# Patient Record
Sex: Male | Born: 1955 | Race: White | Hispanic: No | Marital: Married | Smoking: Former smoker
Health system: Southern US, Community
[De-identification: ages and names within clinical notes are randomized; demographics above are authoritative.]

## PROBLEM LIST (undated history)

## (undated) DIAGNOSIS — E7800 Pure hypercholesterolemia, unspecified: Secondary | ICD-10-CM

## (undated) DIAGNOSIS — E119 Type 2 diabetes mellitus without complications: Secondary | ICD-10-CM

## (undated) DIAGNOSIS — Z87442 Personal history of urinary calculi: Secondary | ICD-10-CM

## (undated) DIAGNOSIS — I10 Essential (primary) hypertension: Secondary | ICD-10-CM

## (undated) DIAGNOSIS — K449 Diaphragmatic hernia without obstruction or gangrene: Secondary | ICD-10-CM

## (undated) DIAGNOSIS — C801 Malignant (primary) neoplasm, unspecified: Secondary | ICD-10-CM

## (undated) DIAGNOSIS — K219 Gastro-esophageal reflux disease without esophagitis: Secondary | ICD-10-CM

## (undated) HISTORY — PX: COLONOSCOPY: SHX174

## (undated) HISTORY — PX: TONSILLECTOMY: SUR1361

## (undated) HISTORY — DX: Diaphragmatic hernia without obstruction or gangrene: K44.9

## (undated) HISTORY — PX: HERNIA REPAIR: SHX51

## (undated) HISTORY — PX: CHOLECYSTECTOMY: SHX55

---

## 2003-03-10 ENCOUNTER — Encounter: Payer: Self-pay | Admitting: Internal Medicine

## 2003-03-10 ENCOUNTER — Ambulatory Visit (HOSPITAL_COMMUNITY): Admission: RE | Admit: 2003-03-10 | Discharge: 2003-03-10 | Payer: Self-pay | Admitting: Internal Medicine

## 2005-10-09 ENCOUNTER — Ambulatory Visit: Admission: RE | Admit: 2005-10-09 | Discharge: 2005-10-09 | Payer: Self-pay

## 2005-10-25 ENCOUNTER — Ambulatory Visit: Payer: Self-pay | Admitting: Pulmonary Disease

## 2005-10-26 ENCOUNTER — Ambulatory Visit (HOSPITAL_COMMUNITY): Admission: RE | Admit: 2005-10-26 | Discharge: 2005-10-26 | Payer: Self-pay | Admitting: Internal Medicine

## 2005-10-26 ENCOUNTER — Ambulatory Visit: Payer: Self-pay | Admitting: Internal Medicine

## 2005-12-20 ENCOUNTER — Ambulatory Visit: Payer: Self-pay | Admitting: Pulmonary Disease

## 2006-08-22 ENCOUNTER — Ambulatory Visit (HOSPITAL_COMMUNITY): Admission: RE | Admit: 2006-08-22 | Discharge: 2006-08-22 | Payer: Self-pay | Admitting: Internal Medicine

## 2007-03-14 ENCOUNTER — Ambulatory Visit (HOSPITAL_COMMUNITY): Admission: RE | Admit: 2007-03-14 | Discharge: 2007-03-14 | Payer: Self-pay | Admitting: Internal Medicine

## 2007-06-21 HISTORY — PX: PROSTATECTOMY: SHX69

## 2009-05-15 ENCOUNTER — Ambulatory Visit (HOSPITAL_COMMUNITY): Admission: RE | Admit: 2009-05-15 | Discharge: 2009-05-15 | Payer: Self-pay | Admitting: Internal Medicine

## 2010-01-11 ENCOUNTER — Ambulatory Visit (HOSPITAL_COMMUNITY): Admission: RE | Admit: 2010-01-11 | Discharge: 2010-01-11 | Payer: Self-pay | Admitting: Internal Medicine

## 2010-11-05 NOTE — Procedures (Signed)
NAME:  Elijah Henson, THIELKE NO.:  [id number]   MEDICAL RECORD NO.:  [id number]          PATIENT TYPE:  OUT   LOCATION:  SLEEP LAB                     FACILITY:  [facility]   PHYSICIAN:  Marcelyn Bruins, M.D. [facility] DATE OF BIRTH:  January 11, 1956   DATE OF STUDY:  10/09/2005                              NOCTURNAL POLYSOMNOGRAM   REFERRING PHYSICIAN:  Dr. Carylon Perches   INDICATION FOR STUDY:  Hypersomnia with sleep apnea.   EPWORTH SCORE:  8   SLEEP ARCHITECTURE:  The patient had a total sleep time of 235 minutes with  decreased slow wave sleep as well as REM.  Sleep onset latency was prolonged  at 109 minutes and REM latency was 140 minutes.  Sleep efficiency was very  poor at 58%.   RESPIRATORY DATA:  The patient was found to have 19 hypopneas, 33 apneas,  and six central apneas for a respiratory disturbance index of 15 events per  hour.  The events were not positional but there was mild to moderate snoring  noted.  The patient did not meet split night criteria secondary to the  events not occurring until well after 1 a.m.   OXYGEN DATA:  There was O2 desaturation as low as 88% with the obstructive  events.   CARDIAC DATA:  No clinically significant cardiac arrhythmias.   MOVEMENT/PARASOMNIAS:  Small numbers of leg jerks that were of no clinical  significance.   IMPRESSION/RECOMMENDATIONS:  Mild to moderate obstructive sleep apnea with a  respiratory disturbance index of 15 events per hour and oxygen desaturation  as low as 88%.  Treatment for this degree of sleep apnea may include weight  loss alone if applicable, upper airway surgery, oral appliance, and also  CPAP.  The decision to treat this patient should be based on how much this  degree of sleep-disordered breathing is affecting his quality of life, and  treatment decisions should also be based on lifestyle as well.                                            ______________________________  Marcelyn Bruins, M.D. [facility]  Diplomate, American Board of Sleep  Medicine    KC/MEDQ  D:  10/20/2005 11:51:22  T:  10/21/2005 08:41:42  Job:  [id number]

## 2010-11-05 NOTE — Op Note (Signed)
NAME:  Elijah Henson, Elijah Henson            ACCOUNT NO.:  [id number]   MEDICAL RECORD NO.:  [id number]          PATIENT TYPE:  AMB   LOCATION:  DAY                           FACILITY:  [facility]   PHYSICIAN:  R. Roetta Sessions, M.D. DATE OF BIRTH:  May 09, 1956   DATE OF PROCEDURE:  10/26/2005  DATE OF DISCHARGE:                                 OPERATIVE REPORT   PROCEDURE:  Screening colonoscopy.   INDICATIONS FOR PROCEDURE:  The patient is a 55 year old gentleman sent over  at the courtesy of Dr. Ouida Sills for colorectal cancer screening. He is devoid  of any lower GI tract symptoms. There is no family history of colon cancer.  He has never had his large GI tract imaged. Colonoscopy is now being done as  a standard screening maneuver. The potential risks, benefits, and  alternatives have been reviewed, questions answered. He is agreeable. Please  the documentation in the medical record.   MONITORING:  O2 saturation, blood pressure, pulse and respirations were  monitored throughout the entire procedure.   CONSCIOUS SEDATION:  Versed 5 mg IV, Demerol 125 mg IV in divided doses.   INSTRUMENT:  Olympus videochip system.   FINDINGS:  Digital rectal exam revealed no abnormalities.   ENDOSCOPIC FINDINGS:  The prep was suboptimal with a thin coating of stool  throughout the colon but it was fairly easily washed away.   RECTUM:  Examination of the rectal mucosa including retroflexed view of the  anal verge revealed no abnormalities.   COLON:  The colonic mucosa was surveyed from the rectosigmoid junction  through the left transverse and right colon to the area of the appendiceal  orifice, ileocecal valve and cecum. These structures were seen and  photographed for the record. From this level, the scope was slowly  withdrawn. All previously mentioned mucosal surfaces were again seen. The  colonic mucosa appeared normal. The patient tolerated the procedure well and  was reacted in endoscopy.   IMPRESSION:  1.  Normal rectum.  2.  Normal colon.   RECOMMENDATIONS:  Repeat screening colonoscopy in 10 years.      [name], M.D.  Electronically Signed     RMR/MEDQ  D:  10/26/2005  T:  10/27/2005  Job:  [id number]   cc:   Kingsley Callander. Ouida Sills, MD  Fax: [phone]

## 2010-11-05 NOTE — Assessment & Plan Note (Signed)
[facility]                               PULMONARY OFFICE NOTE   NAME:Elijah Henson, Elijah Henson                   MRN:          [id number]  DATE:12/20/2005                            DOB:          1955-12-30    HISTORY OF PRESENT ILLNESS:  The patient is a 55 year old gentleman who I  have been asked to see for management of obstructive sleep apnea.  This  patient has recently undergone nocturnal polysomnography on October 11, 2005  where he was found to have a respiratory disturbance index of approximately  15 events per hour.  The patient states that he typically goes to bed  between 10:30 and 11:30 and gets up at 8 a.m. to start his day.  He feels  that four out of seven days he has not rested and it is very hard for him to  get out of bed.  He feels better once he gets up and takes a shower and gets  moving.  He has been noted to have snoring by his wife to the point that she  is now sleeping in another room.  He has also been noted to have pause in  his breathing during sleep.  The patient states that he has some sleepiness  with periods of inactivity in the afternoon, but does fairly well in the  mornings.  He will occasionally take a nap in the afternoon.  He does not  fall asleep in the evening with T.V. or movies and denies any difficulties  with driving.  Even though the patient is very tired whenever he awakens in  the morning, he feels he does much better once he gets going and that this  is not adversely affecting his quality of life.  Of note, his weight is up  about 10 pounds over the last few years.   PAST MEDICAL HISTORY:  1.  Significant for hypertension.  2.  History of dyslipidemia.  3.  History of renal stones.  4.  Status post cholecystectomy.   CURRENT MEDICATIONS:  1.  Lotrel 10 mg daily.  2.  Zocor 20 mg daily.  3.  Urocit K daily.  4.  Aspirin 325 three to four days a week.  5.  Various vitamins and oils.   Patient has no  known drug allergies.   SOCIAL HISTORY:  The patient is married and has children.  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  He smokes an occasional cigar.   FAMILY HISTORY:  Remarkable for heart disease and cancer in his parents.   REVIEW OF SYSTEMS:  As per history of present illness.  Also see patient  intake form documented in the chart.   PHYSICAL EXAMINATION:  GENERAL:  He is a well-developed male in no acute  distress.  VITAL SIGNS:  Blood pressure 138/80, pulse 70, temperature 97.8, weight is  186 pounds.  He is 5 feet 8 inches tall.  O2 saturation is 96% on room air.  HEENT:  Pupils are equal, round, and reactive to light and accommodation.  Extraocular muscles are intact.  Nares  shows very slight deviation to the  left but both passages are quite patent.  Oropharynx shows very mild  elongation of the soft palate and uvula.  NECK:  Supple without JVD or lymphadenopathy.  There is no palpable  thyromegaly.  CHEST:  Totally clear.  CARDIAC:  Regular rate and rhythm.  No murmurs, rubs, or gallops.  ABDOMEN:  Soft, nontender with good bowel sounds.  GENITAL:  Not done and not indicated.  RECTAL:  Not done and not indicated.  BREASTS:  Not done and not indicated.  EXTREMITIES:  Lower extremities are without edema.  Pulses are intact  distally.  NEUROLOGIC:  He is alert and oriented with no obvious motor deficits.   IMPRESSION:  Mild obstructive sleep apnea documented by nocturnal  polysomnography.  Certainly, the patient is not asymptomatic but he does not  feel that it is overly affecting his quality of life at this point in time.  Certainly, this degree of sleep apnea should have very minimal  cardiovascular effects long-term as long as it does not worsen.  I have  outlined various treatment options for the patient including modest weight  loss alone for the next six months, oral appliance, and possibly CPAP.  His  upper airway anatomy is really not overly  significant and therefore probably  would not respond well to surgery.  Patient at this point in time would like  to take six months and work on weight loss.  He states he will call me if he  would like to try additional therapy.   PLAN:  1.  Work aggressively on weight loss over the next six months.  Patient is      to call me for follow-up if he is not successful in losing the weight      and improving his symptoms.  2.  Patient is also to call me should he decide to try an oral appliance or      possibly CPAP.                                   Barbaraann Share, MD, FCCP   [facility]/MedQ  DD:  01/03/2006  DT:  01/03/2006  Job #:  [id number]   cc:   Kingsley Callander. Ouida Sills, MD

## 2011-10-25 ENCOUNTER — Other Ambulatory Visit (HOSPITAL_COMMUNITY): Payer: Self-pay | Admitting: Internal Medicine

## 2011-10-25 DIAGNOSIS — E049 Nontoxic goiter, unspecified: Secondary | ICD-10-CM

## 2011-10-27 ENCOUNTER — Ambulatory Visit (HOSPITAL_COMMUNITY)
Admission: RE | Admit: 2011-10-27 | Discharge: 2011-10-27 | Disposition: A | Discharge status: Home / Self Care | Payer: BC Managed Care – PPO | Source: Ambulatory Visit | Attending: Internal Medicine | Admitting: Internal Medicine

## 2011-10-27 DIAGNOSIS — E049 Nontoxic goiter, unspecified: Secondary | ICD-10-CM | POA: Insufficient documentation

## 2011-10-28 ENCOUNTER — Other Ambulatory Visit (HOSPITAL_COMMUNITY): Payer: Self-pay | Admitting: Internal Medicine

## 2011-10-28 DIAGNOSIS — E079 Disorder of thyroid, unspecified: Secondary | ICD-10-CM

## 2011-11-03 ENCOUNTER — Other Ambulatory Visit (HOSPITAL_COMMUNITY): Payer: Self-pay | Admitting: Internal Medicine

## 2011-11-03 ENCOUNTER — Ambulatory Visit (HOSPITAL_COMMUNITY)
Admission: RE | Admit: 2011-11-03 | Discharge: 2011-11-03 | Disposition: A | Discharge status: Home / Self Care | Payer: BC Managed Care – PPO | Source: Ambulatory Visit | Attending: Internal Medicine | Admitting: Internal Medicine

## 2011-11-03 DIAGNOSIS — E079 Disorder of thyroid, unspecified: Secondary | ICD-10-CM

## 2011-11-03 DIAGNOSIS — E049 Nontoxic goiter, unspecified: Secondary | ICD-10-CM | POA: Insufficient documentation

## 2011-11-03 NOTE — Discharge Instructions (Signed)
Thyroid Biopsy The thyroid gland is a butterfly-shaped gland situated in the front of the neck. It produces hormones which affect metabolism, growth and development, and body temperature. A thyroid biopsy is a procedure in which small samples of tissue or fluid are removed from the thyroid gland or mass and examined under a microscope. This test is done to determine the cause of thyroid problems, such as infection, cancer, or other thyroid problems. There are 2 ways to obtain samples: 1. Fine needle biopsy. Samples are removed using a thin needle inserted through the skin and into the thyroid gland or mass.  2. Open biopsy. Samples are removed after a cut (incision) is made through the skin.  LET YOUR CAREGIVER KNOW ABOUT:   Allergies.   Medications taken including herbs, eye drops, over-the-counter medications, and creams.   Use of steroids (by mouth or creams).   Previous problems with anesthetics or numbing medicine.   Possibility of pregnancy, if this applies.   History of blood clots (thrombophlebitis).   History of bleeding or blood problems.   Previous surgery.   Other health problems.  RISKS AND COMPLICATIONS  Bleeding from the site. The risk of bleeding is higher if you have a bleeding disorder or are taking any blood thinning medications (anticoagulants).   Infection.   Injury to structures near the thyroid gland.  BEFORE THE PROCEDURE  This is a procedure that can be done as an outpatient. Confirm the time that you need to arrive for your procedure. Confirm whether there is a need to fast or withhold any medications. A blood sample may be done to determine your blood clotting time. Medicine may be given to help you relax (sedative). PROCEDURE Fine needle biopsy. You will be awake during the procedure. You may be asked to lie on your back with your head tipped backward to extend your neck. Let your caregiver know if you cannot tolerate the positioning. An area on your  neck will be cleansed. A needle is inserted through the skin of your neck. You may feel a mild discomfort during this procedure. You may be asked to avoid coughing, talking, swallowing, or making sounds during some portions of the procedure. The needle is withdrawn once tissue or fluid samples have been removed. Pressure may be applied to the neck to reduce swelling and ensure that bleeding has stopped. The samples will be sent for examination.  Open biopsy. You will be given general anesthesia. You will be asleep during the procedure. An incision is made in your neck. A sample of thyroid tissue or the mass is removed. The tissue sample or mass will be sent for examination. The sample or mass may be examined during the biopsy. If the sample or mass contains cancer cells, some or all of the thyroid gland may be removed. The incision is closed with stitches. AFTER THE PROCEDURE  Your recovery will be assessed and monitored. If there are no problems, as an outpatient, you should be able to go home shortly after the procedure. If you had a fine needle biopsy:  You may have soreness at the biopsy site for 1 to 2 days.  If you had an open biopsy:   You may have soreness at the biopsy site for 3 to 4 days.   You may have a hoarse voice or sore throat for 1 to 2 days.  Obtaining the Test Results It is your responsibility to obtain your test results. Do not assume everything is normal if you have   not heard from your caregiver or the medical facility. It is important for you to follow up on all of your test results. HOME CARE INSTRUCTIONS   Keeping your head raised on a pillow when you are lying down may ease biopsy site discomfort.   Supporting the back of your head and neck with both hands as you sit up from a lying position may ease biopsy site discomfort.   Only take over-the-counter or prescription medicines for pain, discomfort, or fever as directed by your caregiver.   Throat lozenges or gargling  with warm salt water may help to soothe a sore throat.  SEEK IMMEDIATE MEDICAL CARE IF:   You have severe bleeding from the biopsy site.   You have difficulty swallowing.   You have a fever.   You have increased pain, swelling, redness, or warmth at the biopsy site.   You notice pus coming from the biopsy site.   You have swollen glands (lymph nodes) in your neck.  Document Released: 04/03/2007 Document Revised: 05/26/2011 Document Reviewed: 09/03/2008 ExitCare Patient Information 2012 [employer]. 

## 2011-11-03 NOTE — Procedures (Signed)
PreOperative Dx: Complicated RIGHT thyroid mass question complicated cyst Postoperative Dx: Complicated RIGHT thyroid cyst Procedure:   US guided aspiration Radiologist:  Tyron Russell Anesthesia:  2 ml of 2% lidocaine Specimen:  11 ml of brownish fluid EBL:   < 1 ml Complications: None

## 2011-11-03 NOTE — Progress Notes (Signed)
Patient tolerated procedure well. Discharge instructions given. Patient is clear on instructions.

## 2012-10-23 IMAGING — US US GUIDANCE NEEDLE PLACEMENT
1 series · 9 of 9 positions shown · non-contrast
Comparison: Thyroid ultrasound 10/27/2011

CLINICAL DATA: Right thyroid mass/complicated cyst

IR ULTRASOUND GUIDED ASPIRATION/DRAINAGE, US ASPIRATION

[Series 1: us guidance needle placement · 0.10mm/px · 9 of 9 slices shown]
[im 1/9]
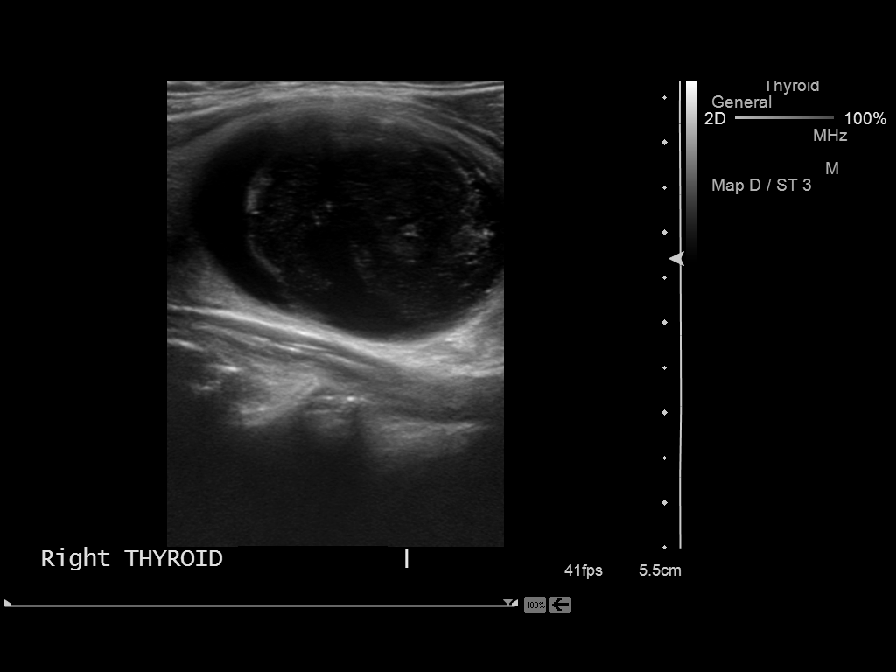
[im 2/9]
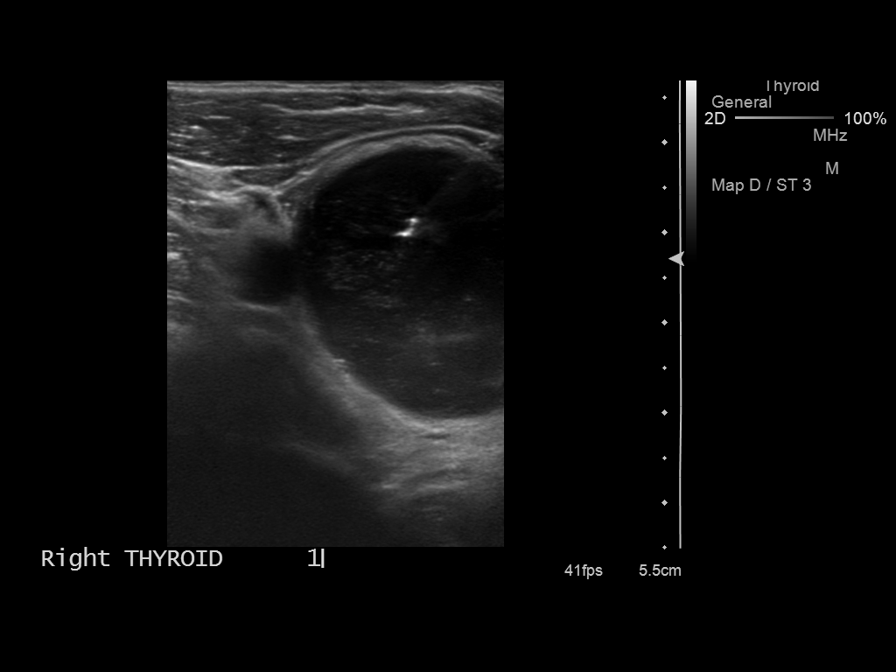
[im 3/9]
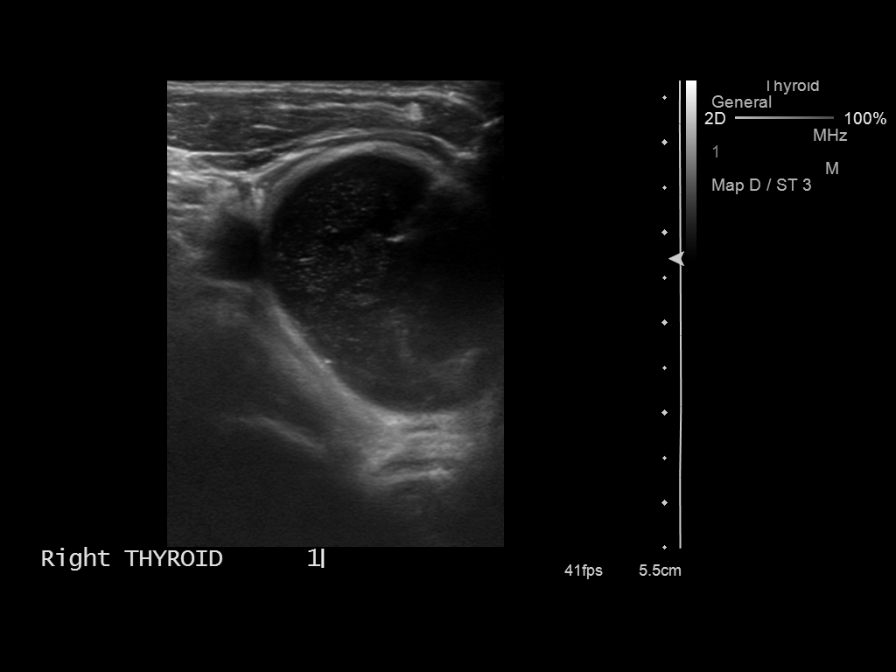
[im 4/9]
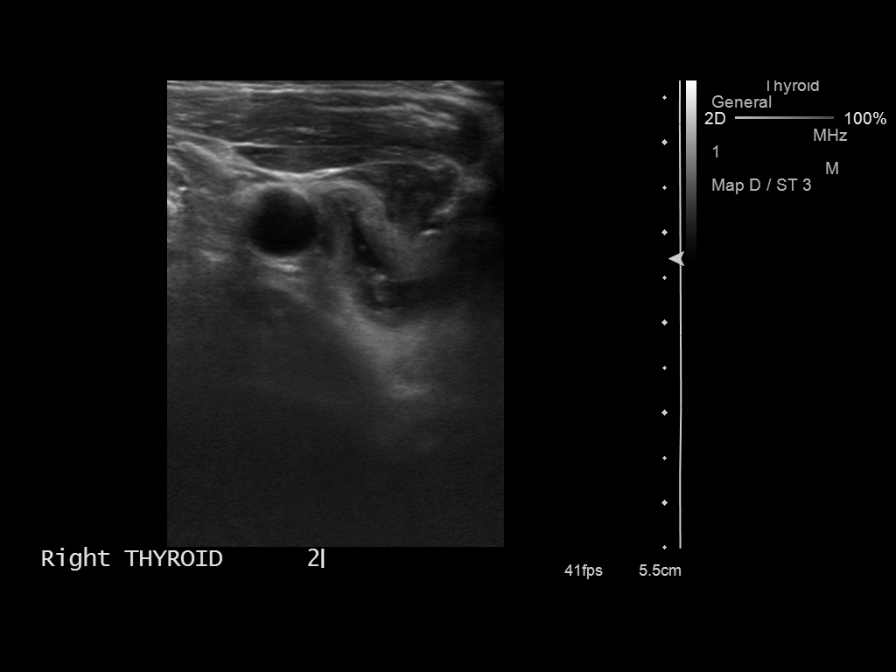
[im 5/9]
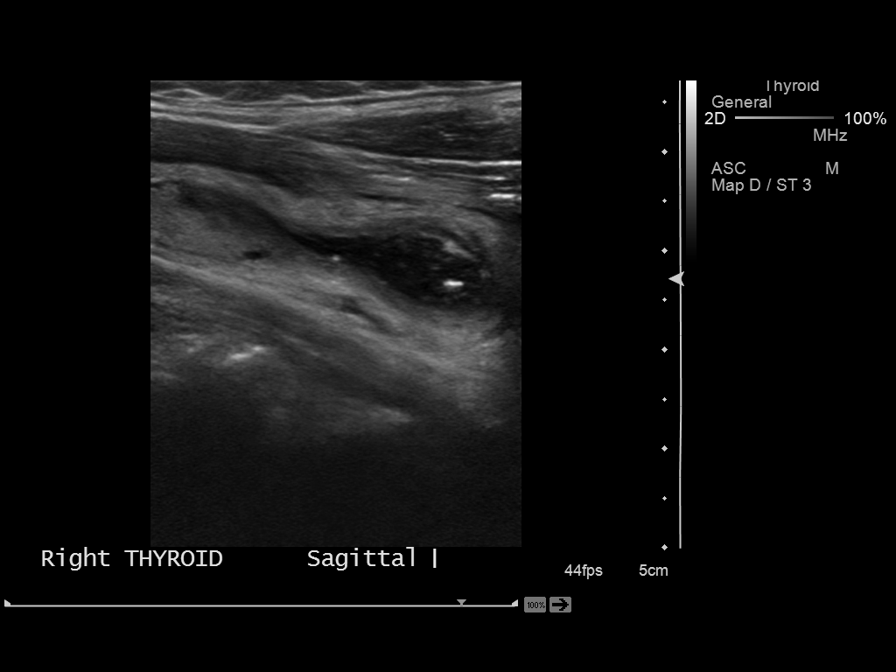
[im 6/9]
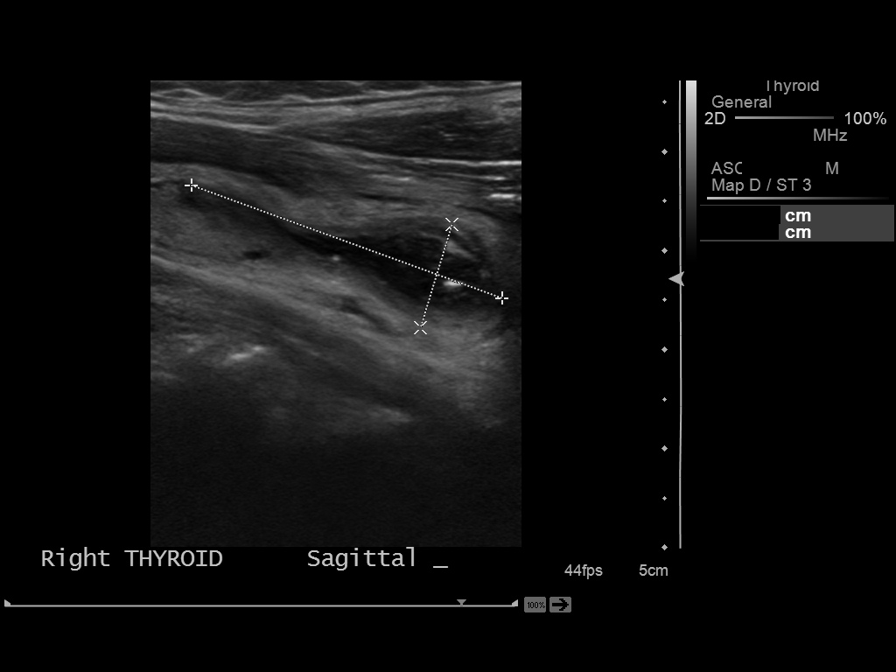
[im 7/9]
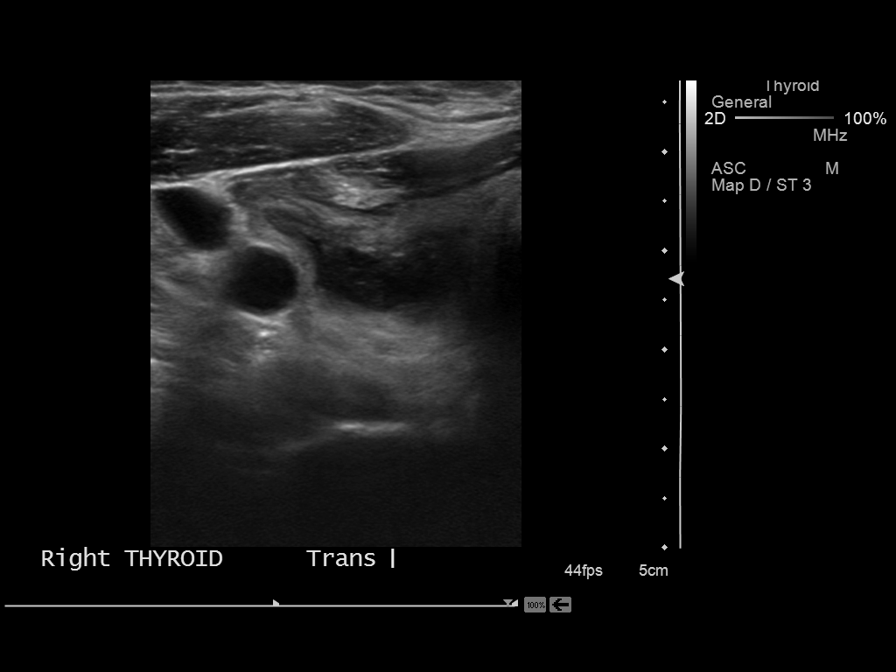
[im 8/9]
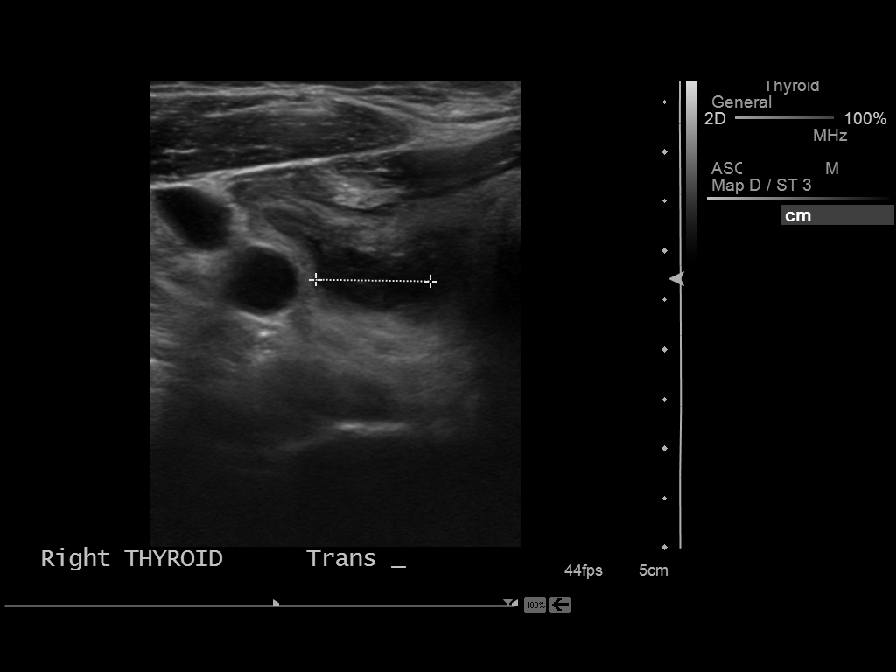
[im 9/9]
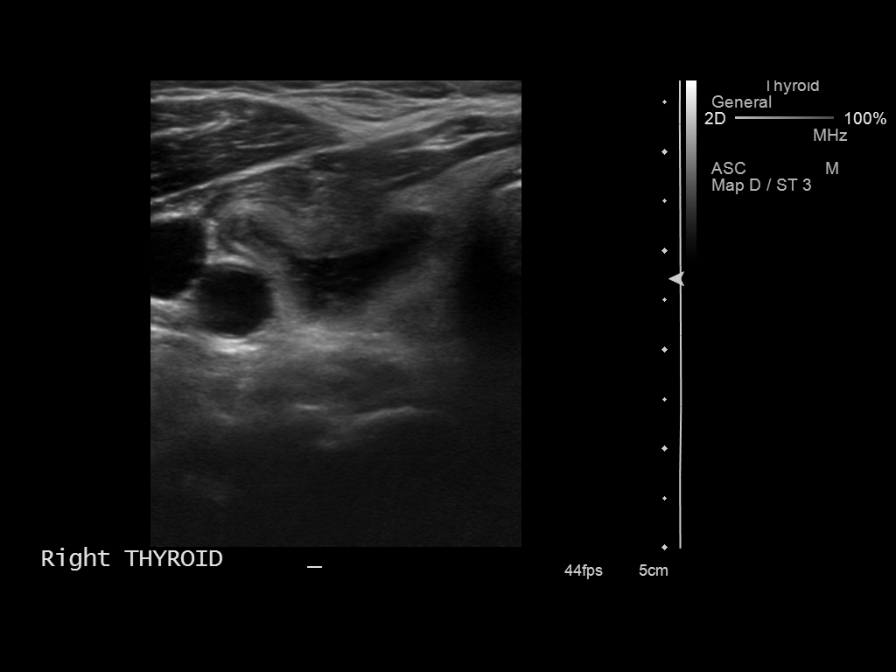

[9 of 9 positions shown; findings below may reference images not displayed]

FINDINGS: Written informed consent for procedure obtained after explanation
of the procedure, benefits, and risks.
Time-out protocol followed.
Skin prepped and draped in usual sterile fashion.
Skin and soft tissues anesthestized with 2 ml of 2% lidocaine.
Under sonographic guidance, a 25 gauge needle was advanced into the
3.8 cm diameter complicated cystic appearing mass in the right
thyroid lobe.
A tiny amount of brownish fluid was aspirated.
Subsequently, under sonographic guidance, an 18 gauge needle was
advanced into the lesion.
11 ml of brownish fluid was then aspirated without difficulty.
Postprocedural images demonstrate near complete collapse of the
previously identified lesion with dependent debris noted,
particularly at its caudal aspect.
The collapsed lesion demonstrates a minimally thickened wall.
Procedure tolerated well by patient without immediate complication.
IMPRESSION: Ultrasound guided aspiration of a complicated cystic lesion in the
right thyroid lobe as above.

## 2014-01-10 ENCOUNTER — Encounter: Payer: Self-pay | Admitting: Pulmonary Disease

## 2016-01-21 ENCOUNTER — Telehealth: Payer: Self-pay

## 2016-01-25 NOTE — Telephone Encounter (Signed)
What does 10/30 under medications mean?

## 2016-01-25 NOTE — Telephone Encounter (Signed)
Sorry but we need to clarify this medication. Sounds like a combo medication, amlodipine is combined with multiple different medications, can we find out which combo he is taking?

## 2016-01-25 NOTE — Telephone Encounter (Signed)
I spoke with [place] at [facility] and she said he is taking Amlodipine/Benazepril 10/20 ( Generic for Lotrell 10/20).

## 2016-01-25 NOTE — Telephone Encounter (Addendum)
Gastroenterology Pre-Procedure Review  Request Date: 01/21/2016 Requesting Physician: Dr. Willey Blade  PATIENT REVIEW QUESTIONS: The patient responded to the following health history questions as indicated:    PT'S LAST COLONOSCOPY WAS 10/2005   1. Diabetes Melitis: no 2. Joint replacements in the past 12 months: no 3. Major health problems in the past 3 months: no 4. Has an artificial valve or MVP: no 5. Has a defibrillator: no 6. Has been advised in past to take antibiotics in advance of a procedure like teeth cleaning: no 7. Family history:NO 8. Alcohol : less than once weekly 9. History of sleep apnea: No    MEDICATIONS & ALLERGIES:    Patient reports the following regarding taking any blood thinners:   Plavix? no Aspirin? no Coumadin? no  Patient confirms/reports the following medications:  Current Outpatient Prescriptions  Medication Sig Dispense Refill  . amLODipine-benazepril (LOTREL) 10-20 MG capsule Take 1 capsule by mouth daily.    . chlorthalidone (HYGROTON) 25 MG tablet Take 25 mg by mouth daily. 1/2 tablet daily    . NON FORMULARY Potassium  10 meq   2 tablets daily    . simvastatin (ZOCOR) 10 MG tablet Take 10 mg by mouth daily.     No current facility-administered medications for this visit.     Patient confirms/reports the following allergies:  No Known Allergies  No orders of the defined types were placed in this encounter.   AUTHORIZATION INFORMATION Primary Insurance:   ID #:   Group #:  Pre-Cert / Auth required:  Pre-Cert / Auth #:   Secondary Insurance:   ID #:  Group #:  Pre-Cert / Auth required:  Pre-Cert / Auth #:   SCHEDULE INFORMATION: Procedure has been scheduled as follows:  Date: 02/10/2016                Time:9:30 Am Location: [facility] Short Stay  This Gastroenterology Pre-Precedure Review Form is being routed to the following provider(s): R. Garfield Cornea, MD

## 2016-01-25 NOTE — Telephone Encounter (Signed)
Sorry, he told me that Amlodipine was 10/30 but I could not find that strength listed.

## 2016-01-25 NOTE — Telephone Encounter (Signed)
Ok to schedule. Med list has been updated. Thanks!

## 2016-02-03 ENCOUNTER — Other Ambulatory Visit: Payer: Self-pay

## 2016-02-03 DIAGNOSIS — Z1211 Encounter for screening for malignant neoplasm of colon: Secondary | ICD-10-CM

## 2016-02-03 MED ORDER — PEG 3350-KCL-NA BICARB-NACL 420 G PO SOLR: 4000.0000 mL | ORAL | 0 refills | Status: DC

## 2016-02-03 NOTE — Telephone Encounter (Signed)
Rx sent to the pharmacy and instructions mailed to pt.  

## 2016-02-03 NOTE — Telephone Encounter (Signed)
Rx has been sent to the pharmacy and instructions mailed to pt.

## 2016-02-09 ENCOUNTER — Telehealth: Payer: Self-pay

## 2016-02-09 NOTE — Telephone Encounter (Signed)
I called [employer] @ [phone] and spoke to [place] who said that a PA is not required for a screening colonoscopy as out patient.

## 2016-02-10 ENCOUNTER — Encounter (HOSPITAL_COMMUNITY): Payer: Self-pay | Admitting: *Deleted

## 2016-02-10 ENCOUNTER — Encounter (HOSPITAL_COMMUNITY)
Admission: RE | Disposition: A | Discharge status: Home / Self Care | Payer: Self-pay | Source: Ambulatory Visit | Attending: Internal Medicine

## 2016-02-10 ENCOUNTER — Ambulatory Visit (HOSPITAL_COMMUNITY)
Admission: RE | Admit: 2016-02-10 | Discharge: 2016-02-10 | Disposition: A | Discharge status: Home / Self Care | Payer: BC Managed Care – PPO | Source: Ambulatory Visit | Attending: Internal Medicine | Admitting: Internal Medicine

## 2016-02-10 DIAGNOSIS — Z79899 Other long term (current) drug therapy: Secondary | ICD-10-CM | POA: Diagnosis not present

## 2016-02-10 DIAGNOSIS — K219 Gastro-esophageal reflux disease without esophagitis: Secondary | ICD-10-CM | POA: Diagnosis not present

## 2016-02-10 DIAGNOSIS — Z1211 Encounter for screening for malignant neoplasm of colon: Secondary | ICD-10-CM | POA: Insufficient documentation

## 2016-02-10 DIAGNOSIS — E7800 Pure hypercholesterolemia, unspecified: Secondary | ICD-10-CM | POA: Insufficient documentation

## 2016-02-10 DIAGNOSIS — Z800 Family history of malignant neoplasm of digestive organs: Secondary | ICD-10-CM | POA: Insufficient documentation

## 2016-02-10 DIAGNOSIS — Z87891 Personal history of nicotine dependence: Secondary | ICD-10-CM | POA: Diagnosis not present

## 2016-02-10 DIAGNOSIS — D123 Benign neoplasm of transverse colon: Secondary | ICD-10-CM | POA: Insufficient documentation

## 2016-02-10 DIAGNOSIS — I10 Essential (primary) hypertension: Secondary | ICD-10-CM | POA: Insufficient documentation

## 2016-02-10 DIAGNOSIS — K5730 Diverticulosis of large intestine without perforation or abscess without bleeding: Secondary | ICD-10-CM | POA: Diagnosis not present

## 2016-02-10 DIAGNOSIS — K635 Polyp of colon: Secondary | ICD-10-CM | POA: Insufficient documentation

## 2016-02-10 HISTORY — PX: POLYPECTOMY: SHX5525

## 2016-02-10 HISTORY — DX: Gastro-esophageal reflux disease without esophagitis: K21.9

## 2016-02-10 HISTORY — DX: Malignant (primary) neoplasm, unspecified: C80.1

## 2016-02-10 HISTORY — PX: COLONOSCOPY: SHX5424

## 2016-02-10 HISTORY — DX: Essential (primary) hypertension: I10

## 2016-02-10 HISTORY — DX: Pure hypercholesterolemia, unspecified: E78.00

## 2016-02-10 SURGERY — COLONOSCOPY
Anesthesia: Moderate Sedation

## 2016-02-10 MED ORDER — ONDANSETRON HCL 4 MG/2ML IJ SOLN
INTRAMUSCULAR | Status: DC | PRN
  Administered 2016-02-10: 4 mg via INTRAVENOUS

## 2016-02-10 MED ORDER — STERILE WATER FOR IRRIGATION IR SOLN
Status: DC | PRN
  Administered 2016-02-10: 10:00:00

## 2016-02-10 MED ORDER — ONDANSETRON HCL 4 MG/2ML IJ SOLN
4.0000 mg | Freq: Once | INTRAMUSCULAR | Status: AC
  Administered 2016-02-10: 4 mg via INTRAVENOUS

## 2016-02-10 MED ORDER — MIDAZOLAM HCL 5 MG/5ML IJ SOLN
INTRAMUSCULAR | Status: DC | PRN
  Administered 2016-02-10 (×3): 2 mg via INTRAVENOUS

## 2016-02-10 MED ORDER — MEPERIDINE HCL 100 MG/ML IJ SOLN
INTRAMUSCULAR | Status: AC
  Filled 2016-02-10: qty 2

## 2016-02-10 MED ORDER — ONDANSETRON HCL 4 MG/2ML IJ SOLN
INTRAMUSCULAR | Status: AC
  Filled 2016-02-10: qty 2

## 2016-02-10 MED ORDER — MIDAZOLAM HCL 5 MG/5ML IJ SOLN
INTRAMUSCULAR | Status: AC
  Filled 2016-02-10: qty 10

## 2016-02-10 MED ORDER — MEPERIDINE HCL 100 MG/ML IJ SOLN
INTRAMUSCULAR | Status: DC | PRN
  Administered 2016-02-10: 50 mg via INTRAVENOUS
  Administered 2016-02-10: 25 mg via INTRAVENOUS
  Administered 2016-02-10: 50 mg via INTRAVENOUS

## 2016-02-10 MED ORDER — SODIUM CHLORIDE 0.9 % IV SOLN
INTRAVENOUS | Status: DC
  Administered 2016-02-10: 09:00:00 via INTRAVENOUS

## 2016-02-10 NOTE — Discharge Instructions (Addendum)
°Colonoscopy °Discharge Instructions ° °Read the instructions outlined below and refer to this sheet in the next few weeks. These discharge instructions provide you with general information on caring for yourself after you leave the hospital. Your doctor may also give you specific instructions. While your treatment has been planned according to the most current medical practices available, unavoidable complications occasionally occur. If you have any problems or questions after discharge, call Dr. Rourk at [phone]. °ACTIVITY °· You may resume your regular activity, but move at a slower pace for the next 24 hours.  °· Take frequent rest periods for the next 24 hours.  °· Walking will help get rid of the air and reduce the bloated feeling in your belly (abdomen).  °· No driving for 24 hours (because of the medicine (anesthesia) used during the test).   °· Do not sign any important legal documents or operate any machinery for 24 hours (because of the anesthesia used during the test).  °NUTRITION °· Drink plenty of fluids.  °· You may resume your normal diet as instructed by your doctor.  °· Begin with a light meal and progress to your normal diet. Heavy or fried foods are harder to digest and may make you feel sick to your stomach (nauseated).  °· Avoid alcoholic beverages for 24 hours or as instructed.  °MEDICATIONS °· You may resume your normal medications unless your doctor tells you otherwise.  °WHAT YOU CAN EXPECT TODAY °· Some feelings of bloating in the abdomen.  °· Passage of more gas than usual.  °· Spotting of blood in your stool or on the toilet paper.  °IF YOU HAD POLYPS REMOVED DURING THE COLONOSCOPY: °· No aspirin products for 7 days or as instructed.  °· No alcohol for 7 days or as instructed.  °· Eat a soft diet for the next 24 hours.  °FINDING OUT THE RESULTS OF YOUR TEST °Not all test results are available during your visit. If your test results are not back during the visit, make an appointment  with your caregiver to find out the results. Do not assume everything is normal if you have not heard from your caregiver or the medical facility. It is important for you to follow up on all of your test results.  °SEEK IMMEDIATE MEDICAL ATTENTION IF: °· You have more than a spotting of blood in your stool.  °· Your belly is swollen (abdominal distention).  °· You are nauseated or vomiting.  °· You have a temperature over 101.  °· You have abdominal pain or discomfort that is severe or gets worse throughout the day.  ° ° ° °Colon polyp and diverticulosis information provided ° °Further recommendations to follow pending review of pathology report ° ° ° ° ° °                                                                                                                     Colon Polyps °Polyps are lumps of extra tissue growing inside the   body. Polyps can grow in the large intestine (colon). Most colon polyps are noncancerous (benign). However, some colon polyps can become cancerous over time. Polyps that are larger than a pea may be harmful. To be safe, caregivers remove and test all polyps. °CAUSES  °Polyps form when mutations in the genes cause your cells to grow and divide even though no more tissue is needed. °RISK FACTORS °There are a number of risk factors that can increase your chances of getting colon polyps. They include: °· Being older than 50 years. °· Family history of colon polyps or colon cancer. °· Long-term colon diseases, such as colitis or Crohn disease. °· Being overweight. °· Smoking. °· Being inactive. °· Drinking too much alcohol. °SYMPTOMS  °Most small polyps do not cause symptoms. If symptoms are present, they may include: °· Blood in the stool. The stool may look dark red or black. °· Constipation or diarrhea that lasts longer than 1 week. °DIAGNOSIS °People often do not know they have polyps until their caregiver finds them during a regular checkup. Your caregiver can use 4 tests to check for  polyps: °· Digital rectal exam. The caregiver wears gloves and feels inside the rectum. This test would find polyps only in the rectum. °· Barium enema. The caregiver puts a liquid called barium into your rectum before taking X-rays of your colon. Barium makes your colon look white. Polyps are dark, so they are easy to see in the X-ray pictures. °· Sigmoidoscopy. A thin, flexible tube (sigmoidoscope) is placed into your rectum. The sigmoidoscope has a light and tiny camera in it. The caregiver uses the sigmoidoscope to look at the last third of your colon. °· Colonoscopy. This test is like sigmoidoscopy, but the caregiver looks at the entire colon. This is the most common method for finding and removing polyps. °TREATMENT  °Any polyps will be removed during a sigmoidoscopy or colonoscopy. The polyps are then tested for cancer. °PREVENTION  °To help lower your risk of getting more colon polyps: °· Eat plenty of fruits and vegetables. Avoid eating fatty foods. °· Do not smoke. °· Avoid drinking alcohol. °· Exercise every day. °· Lose weight if recommended by your caregiver. °· Eat plenty of calcium and folate. Foods that are rich in calcium include milk, cheese, and broccoli. Foods that are rich in folate include chickpeas, kidney beans, and spinach. °HOME CARE INSTRUCTIONS °Keep all follow-up appointments as directed by your caregiver. You may need periodic exams to check for polyps. °SEEK MEDICAL CARE IF: °You notice bleeding during a bowel movement. °  °This information is not intended to replace advice given to you by your health care provider. Make sure you discuss any questions you have with your health care provider. °  °Document Released: 03/02/2004 Document Revised: 06/27/2014 Document Reviewed: 08/16/2011 °Elsevier Interactive Patient Education ©2016 [employer] ° ° ° ° ° ° ° °Diverticulosis °Diverticulosis is the condition that develops when small pouches (diverticula) form in the wall of your colon. Your  colon, or large intestine, is where water is absorbed and stool is formed. The pouches form when the inside layer of your colon pushes through weak spots in the outer layers of your colon. °CAUSES  °No one knows exactly what causes diverticulosis. °RISK FACTORS °· Being older than 50. Your risk for this condition increases with age. Diverticulosis is rare in people younger than 40 years. By age 80, almost everyone has it. °· Eating a low-fiber diet. °· Being frequently constipated. °· Being overweight. °·   Not getting enough exercise. °· Smoking. °· Taking over-the-counter pain medicines, like aspirin and ibuprofen. °SYMPTOMS  °Most people with diverticulosis do not have symptoms. °DIAGNOSIS  °Because diverticulosis often has no symptoms, health care providers often discover the condition during an exam for other colon problems. In many cases, a health care provider will diagnose diverticulosis while using a flexible scope to examine the colon (colonoscopy). °TREATMENT  °If you have never developed an infection related to diverticulosis, you may not need treatment. If you have had an infection before, treatment may include: °· Eating more fruits, vegetables, and grains. °· Taking a fiber supplement. °· Taking a live bacteria supplement (probiotic). °· Taking medicine to relax your colon. °HOME CARE INSTRUCTIONS  °· Drink at least 6-8 glasses of water each day to prevent constipation. °· Try not to strain when you have a bowel movement. °· Keep all follow-up appointments. °If you have had an infection before:  °· Increase the fiber in your diet as directed by your health care provider or dietitian. °· Take a dietary fiber supplement if your health care provider approves. °· Only take medicines as directed by your health care provider. °SEEK MEDICAL CARE IF:  °· You have abdominal pain. °· You have bloating. °· You have cramps. °· You have not gone to the bathroom in 3 days. °SEEK IMMEDIATE MEDICAL CARE IF:  °· Your  pain gets worse. °· Your bloating becomes very bad. °· You have a fever or chills, and your symptoms suddenly get worse. °· You begin vomiting. °· You have bowel movements that are bloody or black. °MAKE SURE YOU: °· Understand these instructions. °· Will watch your condition. °· Will get help right away if you are not doing well or get worse. °  °This information is not intended to replace advice given to you by your health care provider. Make sure you discuss any questions you have with your health care provider. °  °Document Released: 03/03/2004 Document Revised: 06/11/2013 Document Reviewed: 05/01/2013 °Elsevier Interactive Patient Education ©2016 [employer] Inc. ° ° °

## 2016-02-10 NOTE — Op Note (Signed)
[facility] Patient Name: Elijah Henson Procedure Date: 02/10/2016 9:22 AM MRN: [id number] Date of Birth: [birth year] Attending MD: Norvel Richards , MD CSN: [id number] Age: 60 Admit Type: Outpatient Procedure:                Ileo-colonoscopy with snare polypectomy Indications:              Screening for colorectal malignant neoplasm Providers:                Norvel Richards, MD, Jeanann Lewandowsky. Gwenlyn Perking RN, RN,                            Randa Spike, Technician Referring MD:              Medicines:                Midazolam 6 mg IV, Meperidine 125 mg IV,                            Ondansetron 4 mg IV Complications:            No immediate complications. Estimated Blood Loss:     Estimated blood loss was minimal. Procedure:                Pre-Anesthesia Assessment:                           - Prior to the procedure, a History and Physical                            was performed, and patient medications and                            allergies were reviewed. The patient's tolerance of                            previous anesthesia was also reviewed. The risks                            and benefits of the procedure and the sedation                            options and risks were discussed with the patient.                            All questions were answered, and informed consent                            was obtained. Prior Anticoagulants: The patient has                            taken no previous anticoagulant or antiplatelet                            agents. ASA Grade Assessment: II - A patient with  mild systemic disease. After reviewing the risks                            and benefits, the patient was deemed in                            satisfactory condition to undergo the procedure.                           After obtaining informed consent, the colonoscope                            was passed under direct vision. Throughout the                             procedure, the patient's blood pressure, pulse, and                            oxygen saturations were monitored continuously. The                            Colonoscope was introduced through the anus and                            advanced to the the cecum, identified by                            appendiceal orifice and ileocecal valve. The                            colonoscopy was performed without difficulty. The                            patient tolerated the procedure well. The quality                            of the bowel preparation was adequate. The                            ileocecal valve, appendiceal orifice, and rectum                            were photographed. The colonoscopy was performed                            without difficulty. The patient tolerated the                            procedure well. Scope In: 9:50:16 AM Scope Out: 10:09:26 AM Scope Withdrawal Time: 0 hours 15 minutes 46 seconds  Total Procedure Duration: 0 hours 19 minutes 10 seconds  Findings:      The perianal and digital rectal examinations were normal.      A 6 mm polyp was found in the ascending colon. The polyp was  semi-pedunculated. The polyp was removed with a cold snare. Resection       and retrieval were complete. Estimated blood loss was minimal. Estimated       blood loss was minimal.      A 5 mm polyp was found in the descending colon. The polyp was sessile.       The polyp was removed with a cold snare. Resection and retrieval were       complete. Estimated blood loss was minimal.      Scattered small-mouthed diverticula were found in the sigmoid colon and       descending colon.      The exam was otherwise without abnormality on direct and retroflexion       views. The distal 5 cm of terminal ileal mucosa also appeared normal. Impression:               - One 6 mm polyp in the ascending colon, removed                            with a cold snare.  Resected and retrieved.                           - One 5 mm polyp in the descending colon, removed                            with a cold snare. Resected and retrieved.                           - Diverticulosis in the sigmoid colon and in the                            descending colon.                           - The examination was otherwise normal on direct                            and retroflexion views. Moderate Sedation:      Moderate (conscious) sedation was administered by the endoscopy nurse       and supervised by the endoscopist. The following parameters were       monitored: oxygen saturation, heart rate, blood pressure, respiratory       rate, EKG, adequacy of pulmonary ventilation, and response to care.       Total physician intraservice time was 30 minutes. Recommendation:           - Patient has a contact number available for                            emergencies. The signs and symptoms of potential                            delayed complications were discussed with the                            patient. Return to normal activities tomorrow.  Written discharge instructions were provided to the                            patient.                           - Advance diet as tolerated.                           - Continue present medications.                           - Repeat colonoscopy date to be determined after                            pending pathology results are reviewed for                            surveillance based on pathology results.                           - Return to GI office (date not yet determined). Procedure Code(s):        --- Professional ---                           [phone], Colonoscopy, flexible; with removal of                            tumor(s), polyp(s), or other lesion(s) by snare                            technique                           99152, Moderate sedation services provided by the                             same physician or other qualified health care                            professional performing the diagnostic or                            therapeutic service that the sedation supports,                            requiring the presence of an independent trained                            observer to assist in the monitoring of the                            patient's level of consciousness and physiological                            status; initial  15 minutes of intraservice time,                            patient age 59 years or older                           [id number], Moderate sedation services; each additional                            15 minutes intraservice time Diagnosis Code(s):        --- Professional ---                           Z12.11, Encounter for screening for malignant                            neoplasm of colon                           D12.2, Benign neoplasm of ascending colon                           D12.4, Benign neoplasm of descending colon                           K57.30, Diverticulosis of large intestine without                            perforation or abscess without bleeding CPT copyright 2016 American Medical [employer]. All rights reserved. The codes documented in this report are preliminary and upon coder review may  be revised to meet current compliance requirements. Cristopher Estimable. Heriberto Stmartin, MD Norvel Richards, MD 02/10/2016 10:22:06 AM This report has been signed electronically. Number of Addenda: 0

## 2016-02-10 NOTE — H&P (Signed)
@LOGO @   Primary Care Physician:  Asencion Noble, MD Primary Gastroenterologist:  Dr. Gala Romney  Pre-Procedure History & Physical: HPI:  Elijah Henson is a 60 y.o. male is here for a screening colonoscopy. Negative colonoscopy 10 years ago. No bowel symptoms. Family history colon cancer.  Past Medical History:  Diagnosis Date  . Cancer [facility])    prostate  . GERD (gastroesophageal reflux disease)   . Hypercholesteremia   . Hypertension     Past Surgical History:  Procedure Laterality Date  . CHOLECYSTECTOMY  1980's  . COLONOSCOPY    . HERNIA REPAIR     right inguinal hernia repair  . PROSTATECTOMY  2009  . TONSILLECTOMY      Prior to Admission medications   Medication Sig Start Date End Date Taking? Authorizing Provider  amLODipine-benazepril (LOTREL) 10-20 MG capsule Take 1 capsule by mouth daily.   Yes Historical Provider, MD  chlorthalidone (HYGROTON) 25 MG tablet Take 25 mg by mouth daily. 1/2 tablet daily   Yes Historical Provider, MD  lansoprazole (PREVACID) 30 MG capsule Take 30 mg by mouth daily at 12 noon.   Yes Historical Provider, MD  polyethylene glycol-electrolytes (TRILYTE) 420 g solution Take 4,000 mLs by mouth as directed. 02/03/16  Yes Daneil Dolin, MD  potassium chloride (K-DUR) 10 MEQ tablet Take 20 mEq by mouth daily.   Yes Historical Provider, MD  simvastatin (ZOCOR) 10 MG tablet Take 10 mg by mouth daily.   Yes Historical Provider, MD    Allergies as of 02/03/2016  . (No Known Allergies)    History reviewed. No pertinent family history.  Social History   Social History  . Marital status: Married    Spouse name: N/A  . Number of children: N/A  . Years of education: N/A   Occupational History  . Not on file.   Social History Main Topics  . Smoking status: Former Smoker    Years: 5.00    Types: Cigars  . Smokeless tobacco: Never Used  . Alcohol use Yes     Comment: "rarely beer"  . Drug use: No  . Sexual activity: Not on file   Other  Topics Concern  . Not on file   Social History Narrative  . No narrative on file    Review of Systems: See HPI, otherwise negative ROS  Physical Exam: BP (!) 151/85   Pulse 96   Temp 98 F (36.7 C) (Oral)   Resp 14   Ht 5\' 8"  (1.727 m)   Wt 195 lb (88.5 kg)   SpO2 96%   BMI 29.65 kg/m  General:   Alert,  Well-developed, well-nourished, pleasant and cooperative in NAD Neck:  Supple; no masses or thyromegaly. Lungs:  Clear throughout to auscultation.   No wheezes, crackles, or rhonchi. No acute distress. Heart:  Regular rate and rhythm; no murmurs, clicks, rubs,  or gallops. Abdomen:  Soft, nontender and nondistended. No masses, hepatosplenomegaly or hernias noted. Normal bowel sounds, without guarding, and without rebound.   Msk:  Symmetrical without gross deformities. Normal posture. Pulses:  Normal pulses noted. Extremities:  Without clubbing or edema.  Impression/Plan: Elijah Henson is now here to undergo a screening colonoscopy.  Average risk screening examination  Risks, benefits, limitations, imponderables and alternatives regarding colonoscopy have been reviewed with the patient. Questions have been answered. All parties agreeable.        Notice:  This dictation was prepared with Dragon dictation along with smaller phrase technology. Any transcriptional errors that  result from this process are unintentional and may not be corrected upon review.

## 2016-02-11 ENCOUNTER — Encounter: Payer: Self-pay | Admitting: Internal Medicine

## 2016-02-12 ENCOUNTER — Encounter (HOSPITAL_COMMUNITY): Payer: Self-pay | Admitting: Internal Medicine

## 2019-10-25 DIAGNOSIS — E118 Type 2 diabetes mellitus with unspecified complications: Secondary | ICD-10-CM | POA: Diagnosis not present

## 2019-11-01 DIAGNOSIS — N3000 Acute cystitis without hematuria: Secondary | ICD-10-CM | POA: Diagnosis not present

## 2019-11-01 DIAGNOSIS — I10 Essential (primary) hypertension: Secondary | ICD-10-CM | POA: Diagnosis not present

## 2019-11-01 DIAGNOSIS — Z6827 Body mass index (BMI) 27.0-27.9, adult: Secondary | ICD-10-CM | POA: Diagnosis not present

## 2019-11-01 DIAGNOSIS — E1159 Type 2 diabetes mellitus with other circulatory complications: Secondary | ICD-10-CM | POA: Diagnosis not present

## 2019-11-01 DIAGNOSIS — N390 Urinary tract infection, site not specified: Secondary | ICD-10-CM | POA: Diagnosis not present

## 2020-02-06 DIAGNOSIS — D045 Carcinoma in situ of skin of trunk: Secondary | ICD-10-CM | POA: Diagnosis not present

## 2020-02-06 DIAGNOSIS — C44519 Basal cell carcinoma of skin of other part of trunk: Secondary | ICD-10-CM | POA: Diagnosis not present

## 2020-04-02 DIAGNOSIS — E1159 Type 2 diabetes mellitus with other circulatory complications: Secondary | ICD-10-CM | POA: Diagnosis not present

## 2020-04-02 DIAGNOSIS — Z79899 Other long term (current) drug therapy: Secondary | ICD-10-CM | POA: Diagnosis not present

## 2020-04-02 DIAGNOSIS — Z125 Encounter for screening for malignant neoplasm of prostate: Secondary | ICD-10-CM | POA: Diagnosis not present

## 2020-04-02 DIAGNOSIS — C61 Malignant neoplasm of prostate: Secondary | ICD-10-CM | POA: Diagnosis not present

## 2020-04-02 DIAGNOSIS — E785 Hyperlipidemia, unspecified: Secondary | ICD-10-CM | POA: Diagnosis not present

## 2020-04-02 DIAGNOSIS — I10 Essential (primary) hypertension: Secondary | ICD-10-CM | POA: Diagnosis not present

## 2020-04-09 DIAGNOSIS — I10 Essential (primary) hypertension: Secondary | ICD-10-CM | POA: Diagnosis not present

## 2020-04-09 DIAGNOSIS — R7309 Other abnormal glucose: Secondary | ICD-10-CM | POA: Diagnosis not present

## 2020-04-09 DIAGNOSIS — E1129 Type 2 diabetes mellitus with other diabetic kidney complication: Secondary | ICD-10-CM | POA: Diagnosis not present

## 2020-04-09 DIAGNOSIS — E785 Hyperlipidemia, unspecified: Secondary | ICD-10-CM | POA: Diagnosis not present

## 2020-04-09 DIAGNOSIS — Z0001 Encounter for general adult medical examination with abnormal findings: Secondary | ICD-10-CM | POA: Diagnosis not present

## 2020-05-12 DIAGNOSIS — Z23 Encounter for immunization: Secondary | ICD-10-CM | POA: Diagnosis not present

## 2020-07-20 DIAGNOSIS — H35363 Drusen (degenerative) of macula, bilateral: Secondary | ICD-10-CM | POA: Diagnosis not present

## 2020-07-20 DIAGNOSIS — H524 Presbyopia: Secondary | ICD-10-CM | POA: Diagnosis not present

## 2020-10-01 DIAGNOSIS — E1129 Type 2 diabetes mellitus with other diabetic kidney complication: Secondary | ICD-10-CM | POA: Diagnosis not present

## 2020-10-08 DIAGNOSIS — I10 Essential (primary) hypertension: Secondary | ICD-10-CM | POA: Diagnosis not present

## 2020-10-08 DIAGNOSIS — R7309 Other abnormal glucose: Secondary | ICD-10-CM | POA: Diagnosis not present

## 2020-10-08 DIAGNOSIS — E1122 Type 2 diabetes mellitus with diabetic chronic kidney disease: Secondary | ICD-10-CM | POA: Diagnosis not present

## 2020-10-16 ENCOUNTER — Encounter (HOSPITAL_COMMUNITY): Payer: Self-pay | Admitting: Emergency Medicine

## 2020-10-16 ENCOUNTER — Emergency Department (HOSPITAL_COMMUNITY)
Admission: EM | Admit: 2020-10-16 | Discharge: 2020-10-16 | Disposition: A | Discharge status: Home / Self Care | Payer: Medicare PPO | Attending: Emergency Medicine | Admitting: Emergency Medicine

## 2020-10-16 ENCOUNTER — Emergency Department (HOSPITAL_COMMUNITY): Payer: Medicare PPO

## 2020-10-16 ENCOUNTER — Other Ambulatory Visit: Payer: Self-pay

## 2020-10-16 DIAGNOSIS — Z8546 Personal history of malignant neoplasm of prostate: Secondary | ICD-10-CM | POA: Diagnosis not present

## 2020-10-16 DIAGNOSIS — R0789 Other chest pain: Secondary | ICD-10-CM | POA: Diagnosis not present

## 2020-10-16 DIAGNOSIS — I10 Essential (primary) hypertension: Secondary | ICD-10-CM | POA: Diagnosis not present

## 2020-10-16 DIAGNOSIS — Z87891 Personal history of nicotine dependence: Secondary | ICD-10-CM | POA: Diagnosis not present

## 2020-10-16 DIAGNOSIS — E119 Type 2 diabetes mellitus without complications: Secondary | ICD-10-CM | POA: Diagnosis not present

## 2020-10-16 DIAGNOSIS — Z79899 Other long term (current) drug therapy: Secondary | ICD-10-CM | POA: Insufficient documentation

## 2020-10-16 DIAGNOSIS — R079 Chest pain, unspecified: Secondary | ICD-10-CM | POA: Diagnosis not present

## 2020-10-16 DIAGNOSIS — R059 Cough, unspecified: Secondary | ICD-10-CM | POA: Diagnosis not present

## 2020-10-16 HISTORY — DX: Type 2 diabetes mellitus without complications: E11.9

## 2020-10-16 LAB — BASIC METABOLIC PANEL
Anion gap: 11 (ref 5–15)
BUN: 18 mg/dL (ref 8–23)
CO2: 25 mmol/L (ref 22–32)
Calcium: 9.4 mg/dL (ref 8.9–10.3)
Chloride: 101 mmol/L (ref 98–111)
Creatinine, Ser: 1 mg/dL (ref 0.61–1.24)
GFR, Estimated: >60 mL/min (ref >60)
Glucose, Bld: 126 mg/dL — ABNORMAL HIGH (ref 70–99)
Potassium: 3.8 mmol/L (ref 3.5–5.1)
Sodium: 137 mmol/L (ref 135–145)

## 2020-10-16 LAB — CBC WITH DIFFERENTIAL/PLATELET
Abs Immature Granulocytes: 0.04 10*3/uL (ref 0.00–0.07)
Basophils Absolute: 0 10*3/uL (ref 0.0–0.1)
Basophils Relative: 0 %
Eosinophils Absolute: 0 10*3/uL (ref 0.0–0.5)
Eosinophils Relative: 0 %
HCT: 48.6 % (ref 39.0–52.0)
Hemoglobin: 16.3 g/dL (ref 13.0–17.0)
Immature Granulocytes: 0 %
Lymphocytes Relative: 19 %
Lymphs Abs: 1.8 10*3/uL (ref 0.7–4.0)
MCH: 29.6 pg (ref 26.0–34.0)
MCHC: 33.5 g/dL (ref 30.0–36.0)
MCV: 88.2 fL (ref 80.0–100.0)
Monocytes Absolute: 0.7 10*3/uL (ref 0.1–1.0)
Monocytes Relative: 7 %
Neutro Abs: 6.6 10*3/uL (ref 1.7–7.7)
Neutrophils Relative %: 74 %
Platelets: 212 10*3/uL (ref 150–400)
RBC: 5.51 MIL/uL (ref 4.22–5.81)
RDW: 13.5 % (ref 11.5–15.5)
WBC: 9.2 10*3/uL (ref 4.0–10.5)
nRBC: 0 % (ref 0.0–0.2)

## 2020-10-16 LAB — TROPONIN I (HIGH SENSITIVITY)
Troponin I (High Sensitivity): 6 ng/L (ref <18)
Troponin I (High Sensitivity): 7 ng/L (ref <18)

## 2020-10-16 LAB — LIPASE, BLOOD: Lipase: 43 U/L (ref 11–51)

## 2020-10-16 NOTE — ED Notes (Signed)
Garret PA at bedside for [facility]

## 2020-10-16 NOTE — ED Provider Notes (Signed)
[facility] EMERGENCY DEPARTMENT Provider Note   CSN: [id number] Arrival date & time: 10/16/20  1025     History Chief Complaint  Patient presents with  . Chest Pain    Elijah Henson is a 65 y.o. 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  On my examination, patient reports that for the past week he has been working particularly hard clearing out a lot next to his home.  He describes using a steel rake and significant physical exertion.  He has been experiencing intermittent episodes of central chest "pressure".  He states that occasionally it will radiate towards his left arm.  He thought that perhaps it was musculoskeletal.  He denies any obvious aggravating or alleviating factors.  He states that sometimes will happen while at rest, sometimes with activity.  He will usually work through it and it goes away spontaneously after 1 to 2 minutes.  This morning he woke up at 4 AM with similar chest pressure and mild associated nausea, but then it resolved very shortly thereafter.  He then had another episode later this morning while out raking and his wife advised him that he needed to go to the ED for evaluation.   No chest pain or other symptoms at present.  He was recently treated with Augmentin a couple of weeks ago for sinusitis.  He endorsed a cough at that time, but states that it has since improved.  Denies any current fevers, chills, cough, hemoptysis, abdominal pain, vomiting, shortness of breath, diaphoresis, or any other symptoms.  He also denies any history of clots or clotting disorder, unilateral extremity swelling or edema, or other peripheral edema.  HPI     Past Medical History:  Diagnosis Date  . Cancer [facility])    prostate  . Diabetes mellitus without complication ([place])   . GERD (gastroesophageal  reflux disease)   . Hypercholesteremia   . Hypertension     There are no problems to display for this patient.   Past Surgical History:  Procedure Laterality Date  . CHOLECYSTECTOMY  1980's  . COLONOSCOPY    . COLONOSCOPY N/A 02/10/2016   Procedure: COLONOSCOPY;  Surgeon: Daneil Dolin, MD;  Location: [facility] ENDO SUITE;  Service: Endoscopy;  Laterality: N/A;  9:30 AM  . HERNIA REPAIR     right inguinal hernia repair  . POLYPECTOMY  02/10/2016   Procedure: POLYPECTOMY;  Surgeon: Daneil Dolin, MD;  Location: [facility] ENDO SUITE;  Service: Endoscopy;;  . PROSTATECTOMY  2009  . TONSILLECTOMY         History reviewed. No pertinent family history.  Social History   Tobacco Use  . Smoking status: Former Smoker    Years: 5.00    Types: Cigars  . Smokeless tobacco: Never Used  Substance Use Topics  . Alcohol use: Yes    Comment: "rarely beer"  . Drug use: No    Home Medications Prior to Admission medications   Medication Sig Start Date End Date Taking? Authorizing Provider  amLODipine-benazepril (LOTREL) 10-20 MG capsule Take 1 capsule by mouth daily.   Yes [provider]  atorvastatin (LIPITOR) 20 MG tablet Take 1 tablet by mouth daily. 08/25/20  Yes [provider]  chlorthalidone (HYGROTON) 25 MG tablet Take 25 mg by mouth daily. 1/2 tablet daily   Yes [provider]  lansoprazole (PREVACID) 30 MG capsule Take 30 mg by mouth daily at 12 noon.   Yes [provider]  potassium citrate (UROCIT-K) 10 MEQ (1080 MG) SR tablet Take 20 mEq by mouth 2 (two) times daily. 09/17/20  Yes [provider]  Vardenafil HCl 10 MG TBDP Take 1 tablet by mouth as needed. 10/09/20  Yes [provider]  polyethylene glycol-electrolytes (TRILYTE) 420 g solution Take 4,000 mLs by mouth as directed. Patient not taking: Reported on 10/16/2020 02/03/16   Rourk, Cristopher Estimable, MD    Allergies    Patient has no known allergies.  Review of Systems   Review of  Systems  All other systems reviewed and are negative.   Physical Exam Updated Vital Signs BP 121/73   Pulse 74   Temp 98.2 F (36.8 C) (Oral)   Resp (!) 9   Ht 5\' 8"  (1.727 m)   Wt 88.5 kg   SpO2 95%   BMI 29.67 kg/m   Physical Exam Vitals and nursing note reviewed. Exam conducted with a chaperone present.  Constitutional:      General: He is not in acute distress.    Appearance: Normal appearance. He is not toxic-appearing or diaphoretic.  HENT:     Head: Normocephalic and atraumatic.  Eyes:     General: No scleral icterus.    Conjunctiva/sclera: Conjunctivae normal.  Cardiovascular:     Rate and Rhythm: Normal rate and regular rhythm.     Pulses: Normal pulses.     Heart sounds: Normal heart sounds.  Pulmonary:     Effort: Pulmonary effort is normal. No respiratory distress.     Breath sounds: Normal breath sounds. No wheezing or rales.  Abdominal:     General: Abdomen is flat. There is no distension.     Palpations: Abdomen is soft.     Tenderness: There is no abdominal tenderness. There is no guarding.  Musculoskeletal:     Right lower leg: No edema.     Left lower leg: No edema.  Skin:    General: Skin is dry.  Neurological:     Mental Status: He is alert.     GCS: GCS eye subscore is 4. GCS verbal subscore is 5. GCS motor subscore is 6.  Psychiatric:        Mood and Affect: Mood normal.        Behavior: Behavior normal.        Thought Content: Thought content normal.     ED Results / Procedures / Treatments   Labs (all labs ordered are listed, but only abnormal results are displayed) Labs Reviewed  BASIC METABOLIC PANEL - Abnormal; Notable for the following components:      Result Value   Glucose, Bld 126 (*)    All other components within normal limits  CBC WITH DIFFERENTIAL/PLATELET  LIPASE, BLOOD  TROPONIN I (HIGH SENSITIVITY)  TROPONIN I (HIGH SENSITIVITY)    EKG EKG Interpretation  Date/Time:  Friday October 16 2020 10:36:39  EDT Ventricular Rate:  88 PR Interval:  144 QRS Duration: 90 QT Interval:  353 QTC Calculation: 428 R Axis:   77 Text Interpretation: Sinus rhythm RSR' in V1 or V2, right VCD or RVH Baseline wander in lead(s) V3 No old tracing to compare Confirmed by Aletta Edouard [phone]) on 10/16/2020 10:49:34 AM   Radiology DG Chest Portable 1 View  Result Date: 10/16/2020 CLINICAL DATA:  Chest pain. EXAM: PORTABLE CHEST 1 VIEW COMPARISON:  January 01, 2010. FINDINGS: Cardiac  silhouette is likely similar to prior and is accentuated by portable AP technique. Both lungs are clear. No visible pleural effusions or pneumothorax. No acute osseous abnormality. IMPRESSION: No active disease. Electronically Signed   By: Margaretha Sheffield MD   On: 10/16/2020 11:37    Procedures Procedures   Medications Ordered in ED Medications - No data to display  ED Course  I have reviewed the triage vital signs and the nursing notes.  Pertinent labs & imaging results that were available during my care of the patient were reviewed by me and considered in my medical decision making (see chart for details).  Clinical Course as of 10/16/20 1327  Fri Oct 16, 4941  3065 65 year old male here with on and off chest pressure.  No history of cardiac disease.  Had labs EKG chest x-ray that were unrevealing.  Will need outpatient cardiac follow-up.  Return instructions discussed [MB]    Clinical Course User Index [MB] Hayden Rasmussen, MD   MDM Rules/Calculators/A&P                          PHUOC PELLICANO was evaluated in Emergency Department on 10/16/2020 for the symptoms described in the history of present illness. He was evaluated in the context of the global COVID-19 pandemic, which necessitated consideration that the patient might be at risk for infection with the SARS-CoV-2 virus that causes COVID-19. Institutional protocols and algorithms that pertain to the evaluation of patients at risk for COVID-19 are in a state of  rapid change based on information released by regulatory bodies including the CDC and federal and state organizations. These policies and algorithms were followed during the patient's care in the ED.  I personally reviewed patient's medical chart and all notes from triage and staff during today's encounter. I have also ordered and reviewed all labs and imaging that I felt to be medically necessary in the evaluation of this patient's complaints and with consideration of their physical exam. If needed, translation services were available and utilized.   EKG with sinus rhythm, no prior tracings with which to compare.  He denies any chest pain or symptoms on exam.  His history is relatively nonspecific for ACS.  No exertional component and happen spontaneously and is relieved even if he is still exerting himself.  He endorsed mild associated nausea, but no emesis, diaphoresis, or shortness of breath symptoms.  He is heart score 4 given nausea, age, hypertension, and hyperlipidemia.  Troponin is negative, lowering concern for NSTEMI.  I have low suspicion for dissection given normal pulses in extremities and normal mediastinum on CXR.  I reviewed DG Chest and there is no evidence of pneumothorax or consolidation concerning for pneumonia.  There is also no pleural effusion on x-ray or history suggestive of possible esophageal rupture.  Patient is PERC negative, low risk for PE per [place] Criteria.  I discussed the patient the exact etiology of their chest pain is unclear and warrants follow up with a primary care provider. Also discussed that while their risk for ACS is low, it is not completely eliminated and I discussed with patient specific warning signs and return precautions.  Patient states that oftentimes and overnight he wakes up and feels as though he is having upper abdominal/lower chest "pressure" that is relieved with eructation.  He also states that it will come with associated nausea.  Suspects it may  be indigestion.  He is already taking proton pump inhibitor.  I encouraged him to follow-up with his primary care provider about this further.  He is reassured by today's work-up.  Wife is at bedside.  Will refer to cardiology, Dr. Domenic Polite, for ongoing evaluation and management.  ED return precautions discussed.  I encouraged him to take it easy and not overly exert himself until he is seen by cardiology.  Patient voices understanding and is agreeable to the plan.   Final Clinical Impression(s) / ED Diagnoses Final diagnoses:  Nonspecific chest pain    Rx / DC Orders ED Discharge Orders         Ordered    Ambulatory referral to Cardiology        10/16/20 1301           Corena Herter, PA-C 10/16/20 1327    Hayden Rasmussen, MD 10/16/20 1733

## 2020-10-16 NOTE — ED Triage Notes (Signed)
Pt c/o of chest pain x5 days. Pt states it comes and goes with some nausea. States his pain as pressure.

## 2020-10-16 NOTE — Discharge Instructions (Signed)
I provided you with an ambulatory referral to cardiology.  They will call you to schedule appointment for ongoing evaluation and management.  While your work-up today here in the ED was entirely reassuring, I do believe that you would benefit from outpatient cardiac work-up.  You do have both family and personal risk factors for cardiac disease.  Please do not overly exert yourself in the next couple of weeks until he can be evaluated by cardiology.  Return to the ER or seek immediate medical attention should you experience any new or worsening chest pain or other new or worsening symptoms.  Please also notify your primary care provider of today's ED encounter.  Given that you are also having indigestion at night relieved with burping, may benefit from further work-up/management for GERD symptoms.

## 2020-11-06 ENCOUNTER — Other Ambulatory Visit: Payer: Self-pay

## 2020-11-06 ENCOUNTER — Encounter: Payer: Self-pay | Admitting: Internal Medicine

## 2020-11-06 ENCOUNTER — Ambulatory Visit: Payer: Medicare PPO | Admitting: Internal Medicine

## 2020-11-06 VITALS — BP 130/80 | HR 73 | Ht 68.0 in | Wt 184.0 lb

## 2020-11-06 DIAGNOSIS — R0789 Other chest pain: Secondary | ICD-10-CM | POA: Diagnosis not present

## 2020-11-06 DIAGNOSIS — I10 Essential (primary) hypertension: Secondary | ICD-10-CM | POA: Diagnosis not present

## 2020-11-06 DIAGNOSIS — R072 Precordial pain: Secondary | ICD-10-CM

## 2020-11-06 DIAGNOSIS — E785 Hyperlipidemia, unspecified: Secondary | ICD-10-CM | POA: Diagnosis not present

## 2020-11-06 DIAGNOSIS — E1169 Type 2 diabetes mellitus with other specified complication: Secondary | ICD-10-CM

## 2020-11-06 MED ORDER — ASPIRIN EC 81 MG PO TBEC: 81.0000 mg | DELAYED_RELEASE_TABLET | Freq: Every day | ORAL | Status: AC

## 2020-11-06 NOTE — Patient Instructions (Addendum)
Medication Instructions:  - Your physician has recommended you make the following change in your medication:   1) START aspirin 81 mg- take 1 tablet by mouth once daily   *If you need a refill on your cardiac medications before your next appointment, please call your pharmacy*   Lab Work: - none ordered  If you have labs (blood work) drawn today and your tests are completely normal, you will receive your results only by: Marland Kitchen MyChart Message (if you have MyChart) OR . A paper copy in the mail If you have any lab test that is abnormal or we need to change your treatment, we will call you to review the results.   Testing/Procedures: 1) Exercise myoview (stress test):   Your physician has requested that you have an exercise stress myoview.   [place]  Your caregiver has ordered a Stress Test with nuclear imaging. The purpose of this test is to evaluate the blood supply to your heart muscle. This procedure is referred to as a "Non-Invasive Stress Test." This is because other than having an IV started in your vein, nothing is inserted or "invades" your body. Cardiac stress tests are done to find areas of poor blood flow to the heart by determining the extent of coronary artery disease (CAD). Some patients exercise on a treadmill, which naturally increases the blood flow to your heart, while others who are  unable to walk on a treadmill due to physical limitations have a pharmacologic/chemical stress agent called Lexiscan . This medicine will mimic walking on a treadmill by temporarily increasing your coronary blood flow.   Please note: these test may take anywhere between 2-4 hours to complete  PLEASE REPORT TO [place] AT THE FIRST DESK WILL DIRECT YOU WHERE TO GO  Date of Procedure:_____________________________________  Arrival Time for Procedure:______________________________  Instructions regarding medication:   __x__ : Hold CHLORTHALIDONE the  morning of your test  __x_:  You may take the rest of your regular morning medications the day of your test with enough water to get them down safely  PLEASE NOTIFY THE OFFICE AT LEAST 24 HOURS IN ADVANCE IF YOU ARE UNABLE TO [place].  [phone] AND  PLEASE NOTIFY NUCLEAR MEDICINE AT [facility] AT LEAST 24 HOURS IN ADVANCE IF YOU ARE UNABLE TO KEEP YOUR APPOINTMENT. [phone]  How to prepare for your Myoview test:  1. Do not eat or drink after midnight 2. No caffeine for 24 hours prior to test 3. No smoking 24 hours prior to test. 4. Your medication may be taken with water.  If your doctor stopped a medication because of this test, do not take that medication. 5. Ladies, please do not wear dresses.  Skirts or pants are appropriate. Please wear a short sleeve shirt. 6. No perfume, cologne or lotion. 7. Wear comfortable walking shoes. No heels!     Follow-Up: At [facility], you and your health needs are our priority.  As part of our continuing mission to provide you with exceptional heart care, we have created designated Provider Care Teams.  These Care Teams include your primary Cardiologist (physician) and Advanced Practice Providers (APPs -  Physician Assistants and Nurse Practitioners) who all work together to provide you with the care you need, when you need it.  We recommend signing up for the patient portal called "MyChart".  Sign up information is provided on this After Visit Summary.  MyChart is used to connect with patients for Virtual  Visits (Telemedicine).  Patients are able to view lab/test results, encounter notes, upcoming appointments, etc.  Non-urgent messages can be sent to your provider as well.   To learn more about what you can do with MyChart, go to [email or web address].    Your next appointment:   1 month(s)  The format for your next appointment:   In Person  Provider:   You may see Nelva Bush, MD or one of the following Advanced  Practice Providers on your designated Care Team:    Murray Hodgkins, NP  Christell Faith, PA-C  Marrianne Mood, PA-C  Cadence Kathlen Mody, [place]  Laurann Montana, NP    Other Instructions   Cardiac Nuclear Scan A cardiac nuclear scan is a test that is done to check the flow of blood to your heart. It is done when you are resting and when you are exercising. The test looks for problems such as:  Not enough blood reaching a portion of the heart.  The heart muscle not working as it should. You may need this test if:  You have heart disease.  You have had lab results that are not normal.  You have had heart surgery or a balloon procedure to open up blocked arteries (angioplasty).  You have chest pain.  You have shortness of breath. In this test, a special dye (tracer) is put into your bloodstream. The tracer will travel to your heart. A camera will then take pictures of your heart to see how the tracer moves through your heart. This test is usually done at a hospital and takes 2-4 hours. Tell a doctor about:  Any allergies you have.  All medicines you are taking, including vitamins, herbs, eye drops, creams, and over-the-counter medicines.  Any problems you or family members have had with anesthetic medicines.  Any blood disorders you have.  Any surgeries you have had.  Any medical conditions you have.  Whether you are pregnant or may be pregnant. What are the risks? Generally, this is a safe test. However, problems may occur, such as:  Serious chest pain and heart attack. This is only a risk if the stress portion of the test is done.  Rapid heartbeat.  A feeling of warmth in your chest. This feeling usually does not last long.  Allergic reaction to the tracer. What happens before the test?  Ask your doctor about changing or stopping your normal medicines. This is important.  Follow instructions from your doctor about what you cannot eat or drink.  Remove your  jewelry on the day of the test. What happens during the test?  An IV tube will be inserted into one of your veins.  Your doctor will give you a small amount of tracer through the IV tube.  You will wait for 20-40 minutes while the tracer moves through your bloodstream.  Your heart will be monitored with an electrocardiogram (ECG).  You will lie down on an exam table.  Pictures of your heart will be taken for about 15-20 minutes.  You may also have a stress test. For this test, one of these things may be done: ? You will be asked to exercise on a treadmill or a stationary bike. ? You will be given medicines that will make your heart work harder. This is done if you are unable to exercise.  When blood flow to your heart has peaked, a tracer will again be given through the IV tube.  After 20-40 minutes, you will get back on the exam  table. More pictures will be taken of your heart.  Depending on the tracer that is used, more pictures may need to be taken 3-4 hours later.  Your IV tube will be removed when the test is over. The test may vary among doctors and hospitals. What happens after the test?  Ask your doctor: ? Whether you can return to your normal schedule, including diet, activities, and medicines. ? Whether you should drink more fluids. This will help to remove the tracer from your body. Drink enough fluid to keep your pee (urine) pale yellow.  Ask your doctor, or the department that is doing the test: ? When will my results be ready? ? How will I get my results? Summary  A cardiac nuclear scan is a test that is done to check the flow of blood to your heart.  Tell your doctor whether you are pregnant or may be pregnant.  Before the test, ask your doctor about changing or stopping your normal medicines. This is important.  Ask your doctor whether you can return to your normal activities. You may be asked to drink more fluids. This information is not intended to  replace advice given to you by your health care provider. Make sure you discuss any questions you have with your health care provider. Document Revised: 09/26/2018 Document Reviewed: 11/20/2017 Elsevier Patient Education  [place].

## 2020-11-06 NOTE — Progress Notes (Signed)
New Outpatient Visit Date: 11/06/2020  Referring Provider: Corena Herter, PA-C [address] [place],  [place] [address]  Chief Complaint: Chest pain  HPI:  Mr. Wettstein is a 65 y.o. male who is being seen today for the evaluation of chest pain at the request of Mr. Nyoka Cowden. He has a history of hypertension, hyperlipidemia, diet controlled type 2 diabetes mellitus, recurrent Enterococcus UTIs, prostate cancer status post prostatectomy (2009), and GERD.  He presented to the emergency department at [facility] on 10/16/2020 complaining of a 5-day history of chest pain.  He described it as a central chest pressure that began in the setting of doing strenuous activities at home.  Pain occasionally radiated to the left arm.  He noted that the discomfort would sometimes happen at rest and sometimes with exertion, typically resolving after 1 to 2 minutes even if he continue to work.  On the day of presentation, he awoke at 4 AM with similar chest discomfort and nausea that resolved spontaneously after few minutes.  He had recurrent discomfort while raking later that morning, prompting him to come to the emergency department ED evaluation was unrevealing with EKG.  High-sensitivity troponin I was negative x2.  Mr. Grantham reports that he began having chest pain about 5 weeks ago when he was involved in clearing a lot near his home.  He attributes his discomfort to stress though he was also doing some manual labor such as raking debris when it started.  The pain seems to be most pronounced now at night and improves with belching.  He is also awoken several nights around 4 AM with nausea.  The chest pain occurs on a daily basis and seems to be random, usually lasting a few seconds to 1 minute.  He describes it as a tightness in the center of his chest.  It is not exertional and has been improved with an over-the-counter antacid.  He notes that he also recently increased lansoprazole from 15 mg daily to 30 mg  daily on his own.  He denies shortness of breath, palpitations, lightheadedness, and edema.  He reports having undergone a stress test in his 45s and believes it was normal.  Otherwise, he has not undergone prior cardiac testing.  --------------------------------------------------------------------------------------------------  Cardiovascular History & Procedures: Cardiovascular Problems:  Chest pain  Risk Factors:  Hypertension, male gender, prior tobacco use, and age greater than 38  Cath/PCI:  None  CV Surgery:  None  EP Procedures and Devices:  None  Non-Invasive Evaluation(s):  None  Recent CV Pertinent Labs: Lab Results  Component Value Date   K 3.8 10/16/2020   BUN 18 10/16/2020   CREATININE 1.00 10/16/2020    --------------------------------------------------------------------------------------------------  Past Medical History:  Diagnosis Date  . Cancer [facility])    prostate  . Diabetes mellitus without complication ([place])   . GERD (gastroesophageal reflux disease)   . Hiatal hernia   . Hypercholesteremia   . Hypertension     Past Surgical History:  Procedure Laterality Date  . CHOLECYSTECTOMY  1980's  . COLONOSCOPY    . COLONOSCOPY N/A 02/10/2016   Procedure: COLONOSCOPY;  Surgeon: Daneil Dolin, MD;  [facility];  Service: Endoscopy;  Laterality: N/A;  9:30 AM  . HERNIA REPAIR     right inguinal hernia repair  . POLYPECTOMY  02/10/2016   Procedure: POLYPECTOMY;  Surgeon: Daneil Dolin, MD;  Location[facility];  Service: Endoscopy;;  . PROSTATECTOMY  2009  . TONSILLECTOMY  Current Meds  Medication Sig  . amLODipine-benazepril (LOTREL) 10-20 MG capsule Take 1 capsule by mouth daily.  Marland Kitchen aspirin EC 81 MG tablet Take 1 tablet (81 mg total) by mouth daily. Swallow whole.  Marland Kitchen atorvastatin (LIPITOR) 20 MG tablet Take 1 tablet by mouth daily.  . chlorthalidone (HYGROTON) 25 MG tablet Take 25 mg by mouth daily. 1/2 tablet daily  .  lansoprazole (PREVACID) 15 MG capsule Take 30 mg by mouth daily at 12 noon.  . potassium citrate (UROCIT-K) 10 MEQ (1080 MG) SR tablet Take 20 mEq by mouth 2 (two) times daily.  . Vardenafil HCl 10 MG TBDP Take 1 tablet by mouth as needed.  . [DISCONTINUED] lansoprazole (PREVACID) 30 MG capsule Take 60 mg by mouth daily at 12 noon.    Allergies: Patient has no known allergies.  Social History   Tobacco Use  . Smoking status: Former Smoker    Years: 5.00    Types: Cigars    Quit date: 2005    Years since quitting: 17.3  . Smokeless tobacco: Never Used  Vaping Use  . Vaping Use: Never used  Substance Use Topics  . Alcohol use: Yes    Comment: 1 drink per month  . Drug use: No    Family History  Problem Relation Age of Onset  . Breast cancer Mother   . Heart disease Father   . Heart attack Father 65  . Lung cancer Father     Review of Systems: A 12-system review of systems was performed and was negative except as noted in the HPI.  --------------------------------------------------------------------------------------------------  Physical Exam: BP 130/80 (BP Location: Right Arm, Patient Position: Sitting, Cuff Size: Large)   Pulse 73   Ht 5\' 8"  (1.727 m)   Wt 184 lb (83.5 kg)   SpO2 96%   BMI 27.98 kg/m   General: NAD.  Accompanied by his wife. HEENT: No conjunctival pallor or scleral icterus. Facemask in place. Neck: Supple without lymphadenopathy, thyromegaly, JVD, or HJR. No carotid bruit. Lungs: Normal work of breathing. Clear to auscultation bilaterally without wheezes or crackles. Heart: Regular rate and rhythm without murmurs, rubs, or gallops. Non-displaced PMI. Abd: Bowel sounds present. Soft, NT/ND without hepatosplenomegaly Ext: No lower extremity edema. Radial, PT, and DP pulses are 2+ bilaterally Skin: Warm and dry without rash. Neuro: CNIII-XII intact. Strength and fine-touch sensation intact in upper and lower extremities bilaterally. Psych: Normal  mood and affect.  EKG: Normal sinus rhythm with PACs.  Otherwise, no significant abnormality.  Lab Results  Component Value Date   WBC 9.2 10/16/2020   HGB 16.3 10/16/2020   HCT 48.6 10/16/2020   MCV 88.2 10/16/2020   PLT 212 10/16/2020    Lab Results  Component Value Date   NA 137 10/16/2020   K 3.8 10/16/2020   CL 101 10/16/2020   CO2 25 10/16/2020   BUN 18 10/16/2020   CREATININE 1.00 10/16/2020   GLUCOSE 126 (H) 10/16/2020    Outside lipid panel (04/02/2020): Total cholesterol 125, triglycerides 81, HDL 56, LDL 51   --------------------------------------------------------------------------------------------------  ASSESSMENT AND PLAN: Atypical chest pain: Mr. Coolman reports a 5-week history of chest tightness that seems to happen randomly but is most pronounced at night.  It has been accompanied by nausea and seems to improve with belching as well as OTC antacids.  It is not exertional.  Given quality and timing of the pain, I suspect it is most likely GI in nature.  However, given that symptoms began after  Mr. Kindle was doing more strenuous activity than usual, as well as his multiple cardiac risk factors, I think it is prudent to exclude ischemic heart disease with atypical presentation as can be seen in diabetics.  We have discussed noninvasive and invasive testing options and have agreed to proceed with an exercise myocardial perfusion stress test.  In the meantime, I have asked him to begin taking aspirin 81 mg daily.  I think it is reasonable for him to continue with lansoprazole 30 mg daily, which will hopefully help his symptoms if they are related to GERD.  Hypertension: Blood pressure borderline elevated today (goal less than 130/80).  We will defer medication changes today.  Hyperlipidemia associated with type 2 diabetes mellitus: Most recent lipid panel from Dr. Ria Comment office in 03/2020 showed excellent lipids with a total cholesterol of 125,  triglycerides 81, HDL 58, and LDL 51.  No medication changes at this time.  Ongoing management of diabetes per Dr. Willey Blade.  Follow-up: Return to clinic in 1 month.  Nelva Bush, MD 11/08/2020 4:16 PM

## 2020-11-08 ENCOUNTER — Encounter: Payer: Self-pay | Admitting: Internal Medicine

## 2020-11-08 DIAGNOSIS — E785 Hyperlipidemia, unspecified: Secondary | ICD-10-CM | POA: Insufficient documentation

## 2020-11-08 DIAGNOSIS — E1169 Type 2 diabetes mellitus with other specified complication: Secondary | ICD-10-CM | POA: Insufficient documentation

## 2020-11-08 DIAGNOSIS — R0789 Other chest pain: Secondary | ICD-10-CM | POA: Insufficient documentation

## 2020-11-08 DIAGNOSIS — I10 Essential (primary) hypertension: Secondary | ICD-10-CM | POA: Insufficient documentation

## 2020-11-17 ENCOUNTER — Other Ambulatory Visit: Payer: Self-pay

## 2020-11-17 ENCOUNTER — Encounter
Admission: RE | Admit: 2020-11-17 | Discharge: 2020-11-17 | Disposition: A | Discharge status: Home / Self Care | Payer: Medicare PPO | Source: Ambulatory Visit | Attending: Internal Medicine | Admitting: Internal Medicine

## 2020-11-17 DIAGNOSIS — R0789 Other chest pain: Secondary | ICD-10-CM | POA: Diagnosis not present

## 2020-11-17 MED ORDER — TECHNETIUM TC 99M TETROFOSMIN IV KIT
30.0000 | PACK | Freq: Once | INTRAVENOUS | Status: AC | PRN
  Administered 2020-11-17: 33 via INTRAVENOUS

## 2020-11-17 MED ORDER — TECHNETIUM TC 99M TETROFOSMIN IV KIT
10.0000 | PACK | Freq: Once | INTRAVENOUS | Status: AC | PRN
  Administered 2020-11-17: 10.16 via INTRAVENOUS

## 2020-11-18 LAB — NM MYOCAR MULTI W/SPECT W/WALL MOTION / EF
Estimated workload: 7 METS
Exercise duration (min): 4 min
Exercise duration (sec): 27 s
LV dias vol: 66 mL (ref 62–150)
LV sys vol: 25 mL
MPHR: 154 {beats}/min
Peak HR: 133 {beats}/min
Percent HR: 84 %
Rest HR: 100 {beats}/min
TID: 0.91

## 2020-12-16 DIAGNOSIS — W293XXA Contact with powered garden and outdoor hand tools and machinery, initial encounter: Secondary | ICD-10-CM | POA: Diagnosis not present

## 2020-12-16 DIAGNOSIS — I10 Essential (primary) hypertension: Secondary | ICD-10-CM | POA: Diagnosis not present

## 2020-12-16 DIAGNOSIS — S81812A Laceration without foreign body, left lower leg, initial encounter: Secondary | ICD-10-CM | POA: Diagnosis not present

## 2020-12-16 DIAGNOSIS — W268XXA Contact with other sharp object(s), not elsewhere classified, initial encounter: Secondary | ICD-10-CM | POA: Diagnosis not present

## 2020-12-16 NOTE — Progress Notes (Deleted)
   Follow-up Outpatient Visit Date: 12/17/2020  Primary Care Provider: Asencion Noble, MD [address] [place] [address]  Chief Complaint: ***  HPI:  Elijah Henson is a 65 y.o. male with history of hypertension, hyperlipidemia, diet-controlled type 2 diabetes mellitus, recurrent enterococcal UTIs, prostate cancer status post remote prostatectomy, and GERD, who presents for follow-up of chest pain.  I met him in mid May following ED visit for chest pain that occurred while he was clearing a lot near his home.  He continued to have pain intermittently at night, improved with belching.  Subsequent myocardial perfusion stress test was low risk without evidence of ischemia or scar.  Mild coronary artery calcification was noted.  --------------------------------------------------------------------------------------------------  Cardiovascular History & Procedures: Cardiovascular Problems: Chest pain   Risk Factors: Hypertension, male gender, prior tobacco use, and age greater than 77   Cath/PCI: None   CV Surgery: None   EP Procedures and Devices: None   Non-Invasive Evaluation(s): Exercise MPI (11/17/2020): Low risk study without ischemia or scar.  LVEF 63%.  Mild coronary artery calcification.  Recent CV Pertinent Labs: Lab Results  Component Value Date   K 3.8 10/16/2020   BUN 18 10/16/2020   CREATININE 1.00 10/16/2020    Past medical and surgical history were reviewed and updated in EPIC.  No outpatient medications have been marked as taking for the 12/17/20 encounter (Appointment) with Priya Matsen, Harrell Gave, MD.    Allergies: Patient has no known allergies.  Social History   Tobacco Use   Smoking status: Former    Pack years: 0.00    Types: Cigars    Quit date: 2005    Years since quitting: 17.5   Smokeless tobacco: Never  Vaping Use   Vaping Use: Never used  Substance Use Topics   Alcohol use: Yes    Comment: 1 drink per month   Drug use: No     Family History  Problem Relation Age of Onset   Breast cancer Mother    Heart disease Father    Heart attack Father 3   Lung cancer Father     Review of Systems: A 12-system review of systems was performed and was negative except as noted in the HPI.  --------------------------------------------------------------------------------------------------  Physical Exam: There were no vitals taken for this visit.  General:  NAD. Neck: No JVD or HJR. Lungs: Clear to auscultation bilaterally without wheezes or crackles. Heart: Regular rate and rhythm without murmurs, rubs, or gallops. Abdomen: Soft, nontender, nondistended. Extremities: No lower extremity edema.  EKG:  ***  Lab Results  Component Value Date   WBC 9.2 10/16/2020   HGB 16.3 10/16/2020   HCT 48.6 10/16/2020   MCV 88.2 10/16/2020   PLT 212 10/16/2020    Lab Results  Component Value Date   NA 137 10/16/2020   K 3.8 10/16/2020   CL 101 10/16/2020   CO2 25 10/16/2020   BUN 18 10/16/2020   CREATININE 1.00 10/16/2020   GLUCOSE 126 (H) 10/16/2020    No results found for: CHOL, HDL, LDLCALC, LDLDIRECT, TRIG, CHOLHDL  --------------------------------------------------------------------------------------------------  ASSESSMENT AND PLAN: ***  Nelva Bush, MD 12/16/2020 7:31 AM

## 2020-12-17 ENCOUNTER — Ambulatory Visit: Payer: Medicare PPO | Admitting: Internal Medicine

## 2020-12-30 DIAGNOSIS — Z4802 Encounter for removal of sutures: Secondary | ICD-10-CM | POA: Diagnosis not present

## 2021-01-11 ENCOUNTER — Encounter: Payer: Self-pay | Admitting: *Deleted

## 2021-01-21 DIAGNOSIS — L821 Other seborrheic keratosis: Secondary | ICD-10-CM | POA: Diagnosis not present

## 2021-01-21 DIAGNOSIS — D225 Melanocytic nevi of trunk: Secondary | ICD-10-CM | POA: Diagnosis not present

## 2021-01-21 DIAGNOSIS — C4441 Basal cell carcinoma of skin of scalp and neck: Secondary | ICD-10-CM | POA: Diagnosis not present

## 2021-01-21 DIAGNOSIS — D485 Neoplasm of uncertain behavior of skin: Secondary | ICD-10-CM | POA: Diagnosis not present

## 2021-01-27 ENCOUNTER — Encounter: Payer: Self-pay | Admitting: Internal Medicine

## 2021-01-27 ENCOUNTER — Ambulatory Visit: Payer: Medicare PPO | Admitting: Internal Medicine

## 2021-01-27 ENCOUNTER — Other Ambulatory Visit: Payer: Self-pay

## 2021-01-27 VITALS — BP 140/82 | HR 72 | Ht 68.0 in | Wt 188.0 lb

## 2021-01-27 DIAGNOSIS — E1169 Type 2 diabetes mellitus with other specified complication: Secondary | ICD-10-CM

## 2021-01-27 DIAGNOSIS — E785 Hyperlipidemia, unspecified: Secondary | ICD-10-CM | POA: Diagnosis not present

## 2021-01-27 DIAGNOSIS — R0789 Other chest pain: Secondary | ICD-10-CM

## 2021-01-27 DIAGNOSIS — I10 Essential (primary) hypertension: Secondary | ICD-10-CM

## 2021-01-27 NOTE — Progress Notes (Signed)
Follow-up Outpatient Visit Date: 01/27/2021  Primary Care Provider: Asencion Noble, MD [address] [place] [address]  Chief Complaint: Follow-up chest pain  HPI:  Elijah Henson is a 65 y.o. male with history of hypertension, hyperlipidemia, diet controlled type 2 diabetes mellitus, recurrent Enterococcus UTIs, prostate cancer status post prostatectomy (2009), and GERD, who presents for follow-up of chest pain.  I met him in May following ED visit almost a month earlier for chest pain.  Subsequent exercise MPI was normal without ischemia or scar, though mild coronary artery calcification was noted.  Today, Elijah Henson is feeling well.  He denies further chest pain as well as shortness of breath, palpitations, lightheadedness, and edema.  He injured his left leg with a chainsaw in June but is now back to walking and playing golf.  He does not have any limitations.  He periodically checks his blood pressure at the local pharmacy, noting it was most recently 109/65 there.  --------------------------------------------------------------------------------------------------  Cardiovascular History & Procedures: Cardiovascular Problems: Chest pain   Risk Factors: Coronary artery calcification, hypertension, male gender, prior tobacco use, and age greater than 75   Cath/PCI: None   CV Surgery: None   EP Procedures and Devices: None   Non-Invasive Evaluation(s): Exercise MPI (11/17/2020): Normal study without ischemia or scar.  LVEF 63%.  Mild coronary artery calcification.  Recent CV Pertinent Labs: Lab Results  Component Value Date   K 3.8 10/16/2020   BUN 18 10/16/2020   CREATININE 1.00 10/16/2020    Past medical and surgical history were reviewed and updated in EPIC.  Current Meds  Medication Sig   amLODipine-benazepril (LOTREL) 10-20 MG capsule Take 1 capsule by mouth daily.   aspirin EC 81 MG tablet Take 1 tablet (81 mg total) by mouth daily. Swallow whole.    atorvastatin (LIPITOR) 20 MG tablet Take 1 tablet by mouth daily.   chlorthalidone (HYGROTON) 25 MG tablet Take 25 mg by mouth daily. 1/2 tablet daily   lansoprazole (PREVACID) 15 MG capsule Take 30 mg by mouth daily at 12 noon.   potassium citrate (UROCIT-K) 10 MEQ (1080 MG) SR tablet Take 20 mEq by mouth 2 (two) times daily.   Vardenafil HCl 10 MG TBDP Take 1 tablet by mouth as needed.    Allergies: Patient has no known allergies.  Social History   Tobacco Use   Smoking status: Former    Types: Cigars    Quit date: 2005    Years since quitting: 17.6   Smokeless tobacco: Never  Vaping Use   Vaping Use: Never used  Substance Use Topics   Alcohol use: Yes    Comment: 1 drink per month   Drug use: No    Family History  Problem Relation Age of Onset   Breast cancer Mother    Heart disease Father    Heart attack Father 95   Lung cancer Father     Review of Systems: A 12-system review of systems was performed and was negative except as noted in the HPI.  --------------------------------------------------------------------------------------------------  Physical Exam: BP 140/82 (BP Location: Left Arm, Patient Position: Sitting, Cuff Size: Large)   Pulse 72   Ht _0  (1.727 m)   Wt 188 lb (85.3 kg)   SpO2 98%   BMI 28.59 kg/m  Repeat BP: 126/74  General:  NAD. Neck: No JVD or HJR. Lungs: Clear to auscultation bilaterally without wheezes or crackles. Heart: Regular rate and rhythm without murmurs, rubs, or gallops. Abdomen: Soft, nontender,  nondistended. Extremities: No lower extremity edema.   Lab Results  Component Value Date   WBC 9.2 10/16/2020   HGB 16.3 10/16/2020   HCT 48.6 10/16/2020   MCV 88.2 10/16/2020   PLT 212 10/16/2020    Lab Results  Component Value Date   NA 137 10/16/2020   K 3.8 10/16/2020   CL 101 10/16/2020   CO2 25 10/16/2020   BUN 18 10/16/2020   CREATININE 1.00 10/16/2020   GLUCOSE 126 (H) 10/16/2020     --------------------------------------------------------------------------------------------------  ASSESSMENT AND PLAN: Chest pain: No recurrence reported.  Exercise MPI after our last visit was reassuring without evidence of ischemia or scar.  Continue primary prevention.  No further work-up recommended at this time.  Hypertension: Blood pressure borderline elevated today but typically better at home.  Continue current regimen of amlodipine-benazepril as well as chlorthalidone.  Ongoing management per Dr. Willey Blade.  Hyperlipidemia associated with type 2 diabetes mellitus: Continue atorvastatin 20 mg daily with ongoing management per Dr. Willey Blade.  Follow-up: Return to clinic as needed.  Nelva Bush, MD 01/27/2021 10:40 AM

## 2021-01-27 NOTE — Patient Instructions (Signed)
Medication Instructions:   Your physician recommends that you continue on your current medications as directed. Please refer to the Current Medication list given to you today.  *If you need a refill on your cardiac medications before your next appointment, please call your pharmacy*   Lab Work:  None ordered  Testing/Procedures:  None ordered   Follow-Up: At [facility], you and your health needs are our priority.  As part of our continuing mission to provide you with exceptional heart care, we have created designated Provider Care Teams.  These Care Teams include your primary Cardiologist (physician) and Advanced Practice Providers (APPs -  Physician Assistants and Nurse Practitioners) who all work together to provide you with the care you need, when you need it.  We recommend signing up for the patient portal called "MyChart".  Sign up information is provided on this After Visit Summary.  MyChart is used to connect with patients for Virtual Visits (Telemedicine).  Patients are able to view lab/test results, encounter notes, upcoming appointments, etc.  Non-urgent messages can be sent to your provider as well.   To learn more about what you can do with MyChart, go to [email or web address].    Your next appointment:    Follow up as needed   Provider:   You may see Nelva Bush, MD or one of the following Advanced Practice Providers on your designated Care Team:   Murray Hodgkins, NP Christell Faith, PA-C Marrianne Mood, PA-C [place], [place]

## 2021-01-28 ENCOUNTER — Encounter: Payer: Self-pay | Admitting: Gastroenterology

## 2021-02-04 DIAGNOSIS — D225 Melanocytic nevi of trunk: Secondary | ICD-10-CM | POA: Diagnosis not present

## 2021-02-04 DIAGNOSIS — D485 Neoplasm of uncertain behavior of skin: Secondary | ICD-10-CM | POA: Diagnosis not present

## 2021-02-04 DIAGNOSIS — B078 Other viral warts: Secondary | ICD-10-CM | POA: Diagnosis not present

## 2021-02-15 DIAGNOSIS — N200 Calculus of kidney: Secondary | ICD-10-CM | POA: Diagnosis not present

## 2021-02-15 DIAGNOSIS — R82994 Hypercalciuria: Secondary | ICD-10-CM | POA: Diagnosis not present

## 2021-02-15 DIAGNOSIS — R82991 Hypocitraturia: Secondary | ICD-10-CM | POA: Diagnosis not present

## 2021-04-08 DIAGNOSIS — I10 Essential (primary) hypertension: Secondary | ICD-10-CM | POA: Diagnosis not present

## 2021-04-08 DIAGNOSIS — Z79899 Other long term (current) drug therapy: Secondary | ICD-10-CM | POA: Diagnosis not present

## 2021-04-08 DIAGNOSIS — Z85828 Personal history of other malignant neoplasm of skin: Secondary | ICD-10-CM | POA: Diagnosis not present

## 2021-04-08 DIAGNOSIS — L570 Actinic keratosis: Secondary | ICD-10-CM | POA: Diagnosis not present

## 2021-04-08 DIAGNOSIS — K219 Gastro-esophageal reflux disease without esophagitis: Secondary | ICD-10-CM | POA: Diagnosis not present

## 2021-04-08 DIAGNOSIS — E785 Hyperlipidemia, unspecified: Secondary | ICD-10-CM | POA: Diagnosis not present

## 2021-04-08 DIAGNOSIS — X32XXXD Exposure to sunlight, subsequent encounter: Secondary | ICD-10-CM | POA: Diagnosis not present

## 2021-04-08 DIAGNOSIS — Z08 Encounter for follow-up examination after completed treatment for malignant neoplasm: Secondary | ICD-10-CM | POA: Diagnosis not present

## 2021-04-08 DIAGNOSIS — Z125 Encounter for screening for malignant neoplasm of prostate: Secondary | ICD-10-CM | POA: Diagnosis not present

## 2021-04-08 DIAGNOSIS — Z8500 Personal history of malignant neoplasm of unspecified digestive organ: Secondary | ICD-10-CM | POA: Diagnosis not present

## 2021-04-08 DIAGNOSIS — E1129 Type 2 diabetes mellitus with other diabetic kidney complication: Secondary | ICD-10-CM | POA: Diagnosis not present

## 2021-04-15 DIAGNOSIS — E785 Hyperlipidemia, unspecified: Secondary | ICD-10-CM | POA: Diagnosis not present

## 2021-04-15 DIAGNOSIS — Z23 Encounter for immunization: Secondary | ICD-10-CM | POA: Diagnosis not present

## 2021-04-15 DIAGNOSIS — I10 Essential (primary) hypertension: Secondary | ICD-10-CM | POA: Diagnosis not present

## 2021-04-15 DIAGNOSIS — E1122 Type 2 diabetes mellitus with diabetic chronic kidney disease: Secondary | ICD-10-CM | POA: Diagnosis not present

## 2021-04-15 DIAGNOSIS — Z8500 Personal history of malignant neoplasm of unspecified digestive organ: Secondary | ICD-10-CM | POA: Diagnosis not present

## 2021-05-31 ENCOUNTER — Other Ambulatory Visit: Payer: Self-pay

## 2021-05-31 ENCOUNTER — Encounter: Payer: Self-pay | Admitting: Gastroenterology

## 2021-05-31 ENCOUNTER — Ambulatory Visit: Payer: Medicare PPO | Admitting: Gastroenterology

## 2021-05-31 DIAGNOSIS — Z86010 Personal history of colonic polyps: Secondary | ICD-10-CM | POA: Diagnosis not present

## 2021-05-31 NOTE — Patient Instructions (Signed)
Colonoscopy to be scheduled. See separate instructions.  ?

## 2021-05-31 NOTE — Progress Notes (Signed)
Primary Care Physician:  Asencion Noble, MD  Primary Gastroenterologist:  Garfield Cornea, MD   Chief Complaint  Patient presents with   Colonoscopy    Consult. Doing ok    HPI:  Elijah Henson is a 65 y.o. male here to schedule surveillance colonoscopy.  He has a history of tubular adenoma removed back in 2017.  Due to needs for increased conscious sedation, he is being seen today to consider propofol anesthesia.  Patient seen in the ED back in April for atypical chest pain.  Seen by cardiology who felt symptoms likely not cardiac in nature.  Labs unremarkable.  Completed exercise myocardial perfusion stress test which was normal and thought to be a low risk study.  LVEF 63%.  Mild coronary artery calcification noted. No further chest pain, lot of stress then.    He has had relux for a few years. Well controlled on lansoprazole. No dysphagia. No vomiting, abdominal pain, constipation, diarrhea, melena, brbpr.    Current Outpatient Medications  Medication Sig Dispense Refill   amLODipine-benazepril (LOTREL) 10-20 MG capsule Take 1 capsule by mouth daily.     aspirin EC 81 MG tablet Take 1 tablet (81 mg total) by mouth daily. Swallow whole. (Patient taking differently: Take 81 mg by mouth as needed. Marland Kitchen)     atorvastatin (LIPITOR) 20 MG tablet Take 1 tablet by mouth daily.     chlorthalidone (HYGROTON) 25 MG tablet Take 12.5 mg by mouth daily.     lansoprazole (PREVACID) 15 MG capsule Take 15 mg by mouth daily.     potassium citrate (UROCIT-K) 10 MEQ (1080 MG) SR tablet Take 20 mEq by mouth 2 (two) times daily.     Vardenafil HCl 10 MG TBDP Take 1 tablet by mouth as needed.     No current facility-administered medications for this visit.    Allergies as of 05/31/2021   (No Known Allergies)    Past Medical History:  Diagnosis Date   Cancer ([place])    prostate   Diabetes mellitus without complication ([place])    diet controlled   GERD (gastroesophageal reflux disease)    Hiatal hernia     Hypercholesteremia    Hypertension     Past Surgical History:  Procedure Laterality Date   CHOLECYSTECTOMY  1980's   COLONOSCOPY     COLONOSCOPY N/A 02/10/2016   Procedure: COLONOSCOPY;  Surgeon: Daneil Dolin, MD;  [facility];  Service: Endoscopy;  Laterality: N/A;  9:30 AM   HERNIA REPAIR     right inguinal hernia repair   POLYPECTOMY  02/10/2016   Procedure: POLYPECTOMY;  Surgeon: Daneil Dolin, MD;  [facility];  Service: Endoscopy;;   PROSTATECTOMY  2009   TONSILLECTOMY      Family History  Problem Relation Age of Onset   Breast cancer Mother    Heart disease Father    Heart attack Father 74   Lung cancer Father    Colon cancer Neg Hx     Social History   Socioeconomic History   Marital status: Married    Spouse name: Not on file   Number of children: Not on file   Years of education: Not on file   Highest education level: Not on file  Occupational History   Not on file  Tobacco Use   Smoking status: Former    Types: Cigars    Quit date: 2005    Years since quitting: 17.9   Smokeless tobacco: Never  Vaping  Use   Vaping Use: Never used  Substance and Sexual Activity   Alcohol use: Yes    Comment: rare   Drug use: No   Sexual activity: Not on file  Other Topics Concern   Not on file  Social History Narrative   Not on file   Social Determinants of Health   Financial Resource Strain: Not on file  Food Insecurity: Not on file  Transportation Needs: Not on file  Physical Activity: Not on file  Stress: Not on file  Social Connections: Not on file  Intimate Partner Violence: Not on file      ROS:  General: Negative for anorexia, weight loss, fever, chills, fatigue, weakness. Eyes: Negative for vision changes.  ENT: Negative for hoarseness, difficulty swallowing , nasal congestion. CV: Negative for chest pain, angina, palpitations, dyspnea on exertion, peripheral edema.  Respiratory: Negative for dyspnea at rest, dyspnea  on exertion, cough, sputum, wheezing.  GI: See history of present illness. GU:  Negative for dysuria, hematuria, urinary incontinence, urinary frequency, nocturnal urination.  MS: Negative for joint pain, low back pain.  Derm: Negative for rash or itching.  Neuro: Negative for weakness, abnormal sensation, seizure, frequent headaches, memory loss, confusion.  Psych: Negative for anxiety, depression, suicidal ideation, hallucinations.  Endo: Negative for unusual weight change.  Heme: Negative for bruising or bleeding. Allergy: Negative for rash or hives.    Physical Examination:  BP 130/80   Pulse 71   Temp (!) 97.3 F (36.3 C) (Temporal)   Ht 5\' 8"  (1.727 m)   Wt 192 lb 3.2 oz (87.2 kg)   BMI 29.22 kg/m    General: Well-nourished, well-developed in no acute distress.  Head: Normocephalic, atraumatic.   Eyes: Conjunctiva pink, no icterus. Mouth: masked Neck: Supple without thyromegaly, masses, or lymphadenopathy.  Lungs: Clear to auscultation bilaterally.  Heart: Regular rate and rhythm, no murmurs rubs or gallops.  Abdomen: Bowel sounds are normal, nontender, nondistended, no hepatosplenomegaly or masses, no abdominal bruits or    hernia , no rebound or guarding.   Rectal: not performed Extremities: No lower extremity edema. No clubbing or deformities.  Neuro: Alert and oriented x 4 , grossly normal neurologically.  Skin: Warm and dry, no rash or jaundice.   Psych: Alert and cooperative, normal mood and affect.  Labs:  No results found for: ALT, AST, GGT, ALKPHOS, BILITOT Lab Results  Component Value Date   CREATININE 1.00 10/16/2020   BUN 18 10/16/2020   NA 137 10/16/2020   K 3.8 10/16/2020   CL 101 10/16/2020   CO2 25 10/16/2020   Lab Results  Component Value Date   WBC 9.2 10/16/2020   HGB 16.3 10/16/2020   HCT 48.6 10/16/2020   MCV 88.2 10/16/2020   PLT 212 10/16/2020   Lab Results  Component Value Date   LIPASE 43 10/16/2020     Imaging Studies: No  results found.   Assessment:  65 y/o male presents to schedule surveillance colonoscopy for history of adenomatous colon polyps. Last colonoscopy in 2017. No GI symptoms at this time. Due to high dose conscious sedation during last colonoscopy, will plan for deep sedation with propofol.   Plan:  Colonoscopy with Dr. Gala Romney with propofol. ASA II.  I have discussed the risks, alternatives, benefits with regards to but not limited to the risk of reaction to medication, bleeding, infection, perforation and the patient is agreeable to proceed. Written consent to be obtained.

## 2021-06-02 ENCOUNTER — Telehealth: Payer: Self-pay

## 2021-06-02 DIAGNOSIS — Z86010 Personal history of colonic polyps: Secondary | ICD-10-CM

## 2021-06-02 NOTE — Telephone Encounter (Signed)
Called pt to schedule TCS w/Propofol ASA 2 w/Dr. Gala Romney. Offered 06/28/21 but pt would like to wait until later. Advised him we will call back to schedule when remainder of January schedule is available.

## 2021-07-01 ENCOUNTER — Encounter: Payer: Self-pay | Admitting: *Deleted

## 2021-07-01 MED ORDER — PEG 3350-KCL-NA BICARB-NACL 420 G PO SOLR: ORAL | 0 refills | Status: DC

## 2021-07-01 MED ORDER — CLENPIQ 10-3.5-12 MG-GM -GM/160ML PO SOLN: 1.0000 | Freq: Once | ORAL | 0 refills | Status: AC

## 2021-07-01 NOTE — Telephone Encounter (Signed)
Called pt.he has been scheduled for 1/23 at 7:30am. Aware will mail prep instructions and send rx to pharmacy (requested low volume prep). Patient also aware to go next week to lab for lab work done. He voiced understanding.  PA approved via cohere website: auth# [id number], DOS: 07/12/2021 - 10/10/2021

## 2021-07-01 NOTE — Addendum Note (Signed)
Addended by: Cheron Every on: 07/01/2021 03:22 PM   Modules accepted: Orders

## 2021-07-06 ENCOUNTER — Other Ambulatory Visit: Payer: Self-pay

## 2021-07-06 ENCOUNTER — Other Ambulatory Visit (HOSPITAL_COMMUNITY)
Admission: RE | Admit: 2021-07-06 | Discharge: 2021-07-06 | Disposition: A | Discharge status: Home / Self Care | Payer: Medicare PPO | Source: Ambulatory Visit | Attending: Internal Medicine | Admitting: Internal Medicine

## 2021-07-06 DIAGNOSIS — Z86010 Personal history of colonic polyps: Secondary | ICD-10-CM | POA: Insufficient documentation

## 2021-07-06 DIAGNOSIS — Z860101 Personal history of adenomatous and serrated colon polyps: Secondary | ICD-10-CM

## 2021-07-06 LAB — BASIC METABOLIC PANEL
Anion gap: 10 (ref 5–15)
BUN: 18 mg/dL (ref 8–23)
CO2: 28 mmol/L (ref 22–32)
Calcium: 9.6 mg/dL (ref 8.9–10.3)
Chloride: 101 mmol/L (ref 98–111)
Creatinine, Ser: 1 mg/dL (ref 0.61–1.24)
GFR, Estimated: >60 mL/min (ref >60)
Glucose, Bld: 118 mg/dL — ABNORMAL HIGH (ref 70–99)
Potassium: 4.5 mmol/L (ref 3.5–5.1)
Sodium: 139 mmol/L (ref 135–145)

## 2021-07-12 ENCOUNTER — Other Ambulatory Visit: Payer: Self-pay

## 2021-07-12 ENCOUNTER — Encounter (HOSPITAL_COMMUNITY): Payer: Self-pay | Admitting: Internal Medicine

## 2021-07-12 ENCOUNTER — Encounter (HOSPITAL_COMMUNITY)
Admission: RE | Disposition: A | Discharge status: Home / Self Care | Payer: Self-pay | Source: Ambulatory Visit | Attending: Internal Medicine

## 2021-07-12 ENCOUNTER — Ambulatory Visit (HOSPITAL_COMMUNITY): Payer: Medicare PPO | Admitting: Anesthesiology

## 2021-07-12 ENCOUNTER — Ambulatory Visit (HOSPITAL_COMMUNITY)
Admission: RE | Admit: 2021-07-12 | Discharge: 2021-07-12 | Disposition: A | Discharge status: Home / Self Care | Payer: Medicare PPO | Source: Ambulatory Visit | Attending: Internal Medicine | Admitting: Internal Medicine

## 2021-07-12 DIAGNOSIS — Z86010 Personal history of colonic polyps: Secondary | ICD-10-CM

## 2021-07-12 DIAGNOSIS — Z87891 Personal history of nicotine dependence: Secondary | ICD-10-CM | POA: Insufficient documentation

## 2021-07-12 DIAGNOSIS — Z79899 Other long term (current) drug therapy: Secondary | ICD-10-CM | POA: Diagnosis not present

## 2021-07-12 DIAGNOSIS — K219 Gastro-esophageal reflux disease without esophagitis: Secondary | ICD-10-CM | POA: Diagnosis not present

## 2021-07-12 DIAGNOSIS — E7800 Pure hypercholesterolemia, unspecified: Secondary | ICD-10-CM | POA: Insufficient documentation

## 2021-07-12 DIAGNOSIS — E119 Type 2 diabetes mellitus without complications: Secondary | ICD-10-CM | POA: Diagnosis not present

## 2021-07-12 DIAGNOSIS — I10 Essential (primary) hypertension: Secondary | ICD-10-CM | POA: Diagnosis not present

## 2021-07-12 DIAGNOSIS — K5730 Diverticulosis of large intestine without perforation or abscess without bleeding: Secondary | ICD-10-CM | POA: Diagnosis not present

## 2021-07-12 DIAGNOSIS — K621 Rectal polyp: Secondary | ICD-10-CM

## 2021-07-12 DIAGNOSIS — Z1211 Encounter for screening for malignant neoplasm of colon: Secondary | ICD-10-CM | POA: Insufficient documentation

## 2021-07-12 HISTORY — PX: COLONOSCOPY WITH PROPOFOL: SHX5780

## 2021-07-12 HISTORY — PX: POLYPECTOMY: SHX5525

## 2021-07-12 HISTORY — DX: Personal history of urinary calculi: Z87.442

## 2021-07-12 SURGERY — COLONOSCOPY WITH PROPOFOL
Anesthesia: General

## 2021-07-12 MED ORDER — PROPOFOL 1000 MG/100ML IV EMUL
INTRAVENOUS | Status: AC
  Filled 2021-07-12: qty 100

## 2021-07-12 MED ORDER — PROPOFOL 500 MG/50ML IV EMUL
INTRAVENOUS | Status: DC | PRN
  Administered 2021-07-12: 200 ug/kg/min via INTRAVENOUS

## 2021-07-12 MED ORDER — ONDANSETRON HCL 4 MG/2ML IJ SOLN
INTRAMUSCULAR | Status: DC | PRN
  Administered 2021-07-12: 4 mg via INTRAVENOUS

## 2021-07-12 MED ORDER — LIDOCAINE HCL (PF) 2 % IJ SOLN
INTRAMUSCULAR | Status: AC
  Filled 2021-07-12: qty 5

## 2021-07-12 MED ORDER — ONDANSETRON HCL 4 MG/2ML IJ SOLN
INTRAMUSCULAR | Status: AC
  Filled 2021-07-12: qty 2

## 2021-07-12 MED ORDER — LIDOCAINE HCL (CARDIAC) PF 100 MG/5ML IV SOSY
PREFILLED_SYRINGE | INTRAVENOUS | Status: DC | PRN
  Administered 2021-07-12: 50 mg via INTRAVENOUS

## 2021-07-12 MED ORDER — LACTATED RINGERS IV SOLN: INTRAVENOUS | Status: DC

## 2021-07-12 MED ORDER — PROPOFOL 10 MG/ML IV BOLUS
INTRAVENOUS | Status: DC | PRN
Start: 2021-07-12 — End: 2021-07-12
  Administered 2021-07-12 (×2): 50 mg via INTRAVENOUS

## 2021-07-12 MED ORDER — STERILE WATER FOR IRRIGATION IR SOLN
Status: DC | PRN
  Administered 2021-07-12: 120 mL

## 2021-07-12 NOTE — H&P (Signed)
@LOGO @   Primary Care Physician:  Asencion Noble, MD Primary Gastroenterologist:  Dr. Gala Romney  Pre-Procedure History & Physical: HPI:  Elijah Henson is a 66 y.o. male here for a surveillance colonoscopy.  A history of multiple colonic adenomas removed 2017; no bowel symptoms currently.  Here for surveillance examination  Past Medical History:  Diagnosis Date   Cancer [facility])    prostate   Diabetes mellitus without complication ([facility])    diet controlled   GERD (gastroesophageal reflux disease)    Hiatal hernia    History of kidney stones    Hypercholesteremia    Hypertension     Past Surgical History:  Procedure Laterality Date   CHOLECYSTECTOMY  1980's   COLONOSCOPY     COLONOSCOPY N/A 02/10/2016   Procedure: COLONOSCOPY;  Surgeon: Daneil Dolin, MD;  Location: [facility];  Service: Endoscopy;  Laterality: N/A;  9:30 AM   HERNIA REPAIR     right inguinal hernia repair   POLYPECTOMY  02/10/2016   Procedure: POLYPECTOMY;  Surgeon: Daneil Dolin, MD;  Location: [facility];  Service: Endoscopy;;   PROSTATECTOMY  2009   TONSILLECTOMY      Prior to Admission medications   Medication Sig Start Date End Date Taking? Authorizing Provider  amLODipine-benazepril (LOTREL) 10-20 MG capsule Take 1 capsule by mouth daily.   Yes [provider]  aspirin EC 81 MG tablet Take 1 tablet (81 mg total) by mouth daily. Swallow whole. Patient taking differently: Take 81 mg by mouth 2 (two) times a week. . 11/06/20  Yes End, Harrell Gave, MD  atorvastatin (LIPITOR) 20 MG tablet Take 20 mg by mouth daily. 08/25/20  Yes [provider]  chlorthalidone (HYGROTON) 25 MG tablet Take 12.5 mg by mouth daily.   Yes [provider]  DM-Doxylamine-Acetaminophen (NYQUIL COLD & FLU PO) Take 15 mLs by mouth at bedtime.   Yes [provider]  HYDROcodone bit-homatropine (HYCODAN) 5-1.5 MG/5ML syrup Take 5 mLs by mouth every 4 (four) hours as needed for cough. 06/29/21  Yes  [provider]  lansoprazole (PREVACID) 15 MG capsule Take 15 mg by mouth daily.   Yes [provider]  potassium citrate (UROCIT-K) 10 MEQ (1080 MG) SR tablet Take 20 mEq by mouth 2 (two) times daily. 09/17/20  Yes [provider]  Vardenafil HCl 10 MG TBDP Take 1 tablet by mouth as needed (ED). 10/09/20  Yes [provider]  cefdinir (OMNICEF) 300 MG capsule Take 300 mg by mouth 2 (two) times daily. 06/30/21   [provider]    Allergies as of 07/01/2021   (No Known Allergies)    Family History  Problem Relation Age of Onset   Breast cancer Mother    Heart disease Father    Heart attack Father 82   Lung cancer Father    Colon cancer Neg Hx     Social History   Socioeconomic History   Marital status: Married    Spouse name: Not on file   Number of children: Not on file   Years of education: Not on file   Highest education level: Not on file  Occupational History   Not on file  Tobacco Use   Smoking status: Former    Types: Cigars    Quit date: 2005    Years since quitting: 18.0   Smokeless tobacco: Never  Vaping Use   Vaping Use: Never used  Substance and Sexual Activity   Alcohol use: Yes  Comment: rare   Drug use: No   Sexual activity: Not on file  Other Topics Concern   Not on file  Social History Narrative   Not on file   Social Determinants of Health   Financial Resource Strain: Not on file  Food Insecurity: Not on file  Transportation Needs: Not on file  Physical Activity: Not on file  Stress: Not on file  Social Connections: Not on file  Intimate Partner Violence: Not on file    Review of Systems: See HPI, otherwise negative ROS  Physical Exam: BP (!) 153/88    Temp 98.2 F (36.8 C) (Oral)    Resp 10    Ht 5\' 8"  (1.727 m)    Wt 84.8 kg    SpO2 96%    BMI 28.43 kg/m  General:   Alert,  Well-developed, well-nourished, pleasant and cooperative in NAD Mouth:  No deformity or lesions. Neck:  Supple;  no masses or thyromegaly. No significant cervical adenopathy. Abdomen: Non-distended, normal bowel sounds.  Soft and nontender without appreciable mass or hepatosplenomegaly.  Pulses:  Normal pulses noted. Extremities:  Without clubbing or edema.  Impression/Plan: 66 year old gentleman history of colonic adenomas removed previously; here for surveillance examination.  I have offered the patient a surveillance colonoscopy per plan.  The risks, benefits, limitations, alternatives and imponderables have been reviewed with the patient. Questions have been answered. All parties are agreeable.  ASA 2. Propofol. Discussed with anesthesia.    Notice: This dictation was prepared with Dragon dictation along with smaller phrase technology. Any transcriptional errors that result from this process are unintentional and may not be corrected upon review.

## 2021-07-12 NOTE — Discharge Instructions (Addendum)
EGD Discharge instructions Please read the instructions outlined below and refer to this sheet in the next few weeks. These discharge instructions provide you with general information on caring for yourself after you leave the hospital. Your doctor may also give you specific instructions. While your treatment has been planned according to the most current medical practices available, unavoidable complications occasionally occur. If you have any problems or questions after discharge, please call your doctor. ACTIVITY You may resume your regular activity but move at a slower pace for the next 24 hours.  Take frequent rest periods for the next 24 hours.  Walking will help expel (get rid of) the air and reduce the bloated feeling in your abdomen.  No driving for 24 hours (because of the anesthesia (medicine) used during the test).  You may shower.  Do not sign any important legal documents or operate any machinery for 24 hours (because of the anesthesia used during the test).  NUTRITION Drink plenty of fluids.  You may resume your normal diet.  Begin with a light meal and progress to your normal diet.  Avoid alcoholic beverages for 24 hours or as instructed by your caregiver.  MEDICATIONS You may resume your normal medications unless your caregiver tells you otherwise.  WHAT YOU CAN EXPECT TODAY You may experience abdominal discomfort such as a feeling of fullness or gas pains.  FOLLOW-UP Your doctor will discuss the results of your test with you.  SEEK IMMEDIATE MEDICAL ATTENTION IF ANY OF THE FOLLOWING OCCUR: Excessive nausea (feeling sick to your stomach) and/or vomiting.  Severe abdominal pain and distention (swelling).  Trouble swallowing.  Temperature over 101 F (37.8 C).  Rectal bleeding or vomiting of blood.    2 polyps removed in your colon today  Diverticulosis and polyp information provided  Further recommendations to follow pending review of pathology report  Patient  request, I called Gina at [phone] findings and recommendations

## 2021-07-12 NOTE — Transfer of Care (Signed)
Immediate Anesthesia Transfer of Care Note  Patient: Elijah Henson  Procedure(s) Performed: COLONOSCOPY WITH PROPOFOL POLYPECTOMY  Patient Location: PACU  Anesthesia Type:General  Level of Consciousness: awake, alert , oriented and patient cooperative  Airway & Oxygen Therapy: Patient Spontanous Breathing  Post-op Assessment: Report given to RN, Post -op Vital signs reviewed and stable and Patient moving all extremities X 4  Post vital signs: Reviewed and stable  Last Vitals:  Vitals Value Taken Time  BP    Temp    Pulse    Resp    SpO2      Last Pain:  Vitals:   07/12/21 0736  TempSrc:   PainSc: 0-No pain      Patients Stated Pain Goal: 5 (08/01/15 3567)  Complications: No notable events documented.

## 2021-07-12 NOTE — Anesthesia Preprocedure Evaluation (Signed)
Anesthesia Evaluation  Patient identified by MRN, date of birth, ID band Patient awake    Reviewed: Allergy & Precautions, H&P , NPO status , Patient's Chart, lab work & pertinent test results, reviewed documented beta blocker date and time   Airway Mallampati: II  TM Distance: >3 FB Neck ROM: full    Dental no notable dental hx.    Pulmonary neg pulmonary ROS, former smoker,    Pulmonary exam normal breath sounds clear to auscultation       Cardiovascular Exercise Tolerance: Good hypertension, negative cardio ROS   Rhythm:regular Rate:Normal     Neuro/Psych negative neurological ROS  negative psych ROS   GI/Hepatic Neg liver ROS, hiatal hernia, GERD  Medicated,  Endo/Other  negative endocrine ROSdiabetes, Well Controlled, Type 2  Renal/GU negative Renal ROS  negative genitourinary   Musculoskeletal   Abdominal   Peds  Hematology negative hematology ROS (+)   Anesthesia Other Findings   Reproductive/Obstetrics negative OB ROS                             Anesthesia Physical Anesthesia Plan  ASA: 2  Anesthesia Plan: General   Post-op Pain Management:    Induction:   PONV Risk Score and Plan: Propofol infusion  Airway Management Planned:   Additional Equipment:   Intra-op Plan:   Post-operative Plan:   Informed Consent: I have reviewed the patients History and Physical, chart, labs and discussed the procedure including the risks, benefits and alternatives for the proposed anesthesia with the patient or authorized representative who has indicated his/her understanding and acceptance.     Dental Advisory Given  Plan Discussed with: CRNA  Anesthesia Plan Comments:         Anesthesia Quick Evaluation

## 2021-07-12 NOTE — Op Note (Signed)
[facility] Patient Name: Elijah Henson Procedure Date: 07/12/2021 7:14 AM MRN: [id number] Date of Birth: [birth year] Attending MD: Norvel Richards , MD CSN: [id number] Age: 66 Admit Type: Outpatient Procedure:                Colonoscopy Indications:              High risk colon cancer surveillance: Personal                            history of colonic polyps Providers:                Norvel Richards, MD, Lambert Mody,                            Kristine L. Risa Grill, Technician Referring MD:              Medicines:                Propofol per Anesthesia Complications:            No immediate complications. Estimated Blood Loss:     Estimated blood loss was minimal. Procedure:                Pre-Anesthesia Assessment:                           - Prior to the procedure, a History and Physical                            was performed, and patient medications and                            allergies were reviewed. The patient's tolerance of                            previous anesthesia was also reviewed. The risks                            and benefits of the procedure and the sedation                            options and risks were discussed with the patient.                            All questions were answered, and informed consent                            was obtained. Prior Anticoagulants: The patient has                            taken no previous anticoagulant or antiplatelet                            agents. ASA Grade Assessment: II - A patient with  mild systemic disease. After reviewing the risks                            and benefits, the patient was deemed in                            satisfactory condition to undergo the procedure.                           After obtaining informed consent, the colonoscope                            was passed under direct vision. Throughout the                             procedure, the patient's blood pressure, pulse, and                            oxygen saturations were monitored continuously. The                            [id number] scope was introduced through the                            anus and advanced to the the cecum, identified by                            appendiceal orifice and ileocecal valve. The                            colonoscopy was performed without difficulty. The                            patient tolerated the procedure well. The quality                            of the bowel preparation was adequate. The entire                            colon was well visualized. Scope In: 7:40:24 AM Scope Out: 7:54:56 AM Scope Withdrawal Time: 0 hours 10 minutes 45 seconds  Total Procedure Duration: 0 hours 14 minutes 32 seconds  Findings:      The perianal and digital rectal examinations were normal.      Scattered medium-mouthed diverticula were found in the sigmoid colon and       descending colon.      Two sessile polyps were found in the mid rectum. The polyps were 4 to 5       mm in size. These polyps were removed with a cold snare. Resection and       retrieval were complete. Estimated blood loss was minimal.      The exam was otherwise without abnormality on direct and retroflexion       views. Impression:               - Diverticulosis  in the sigmoid colon and in the                            descending colon.                           - Two 4 to 5 mm polyps in the mid rectum, removed                            with a cold snare. Resected and retrieved.                           - The examination was otherwise normal on direct                            and retroflexion views. Moderate Sedation:      Moderate (conscious) sedation was personally administered by an       anesthesia professional. The following parameters were monitored: oxygen       saturation, heart rate, blood pressure, respiratory rate, EKG, adequacy        of pulmonary ventilation, and response to care. Recommendation:           - Patient has a contact number available for                            emergencies. The signs and symptoms of potential                            delayed complications were discussed with the                            patient. Return to normal activities tomorrow.                            Written discharge instructions were provided to the                            patient.                           - Advance diet as tolerated.                           - Continue present medications.                           - Repeat colonoscopy date to be determined after                            pending pathology results are reviewed for                            surveillance.                           - Return to GI office (date not  yet determined). Procedure Code(s):        --- Professional ---                           [phone], Colonoscopy, flexible; with removal of                            tumor(s), polyp(s), or other lesion(s) by snare                            technique Diagnosis Code(s):        --- Professional ---                           Z86.010, Personal history of colonic polyps                           K62.1, Rectal polyp                           K57.30, Diverticulosis of large intestine without                            perforation or abscess without bleeding CPT copyright 2019 [employer]. All rights reserved. The codes documented in this report are preliminary and upon coder review may  be revised to meet current compliance requirements. Cristopher Estimable. Pennie Vanblarcom, MD Norvel Richards, MD 07/12/2021 8:00:27 AM This report has been signed electronically. Number of Addenda: 0

## 2021-07-12 NOTE — Anesthesia Postprocedure Evaluation (Signed)
Anesthesia Post Note  Patient: Elijah Henson  Procedure(s) Performed: COLONOSCOPY WITH PROPOFOL POLYPECTOMY  Patient location during evaluation: Phase II Anesthesia Type: General Level of consciousness: awake Pain management: pain level controlled Vital Signs Assessment: post-procedure vital signs reviewed and stable Respiratory status: spontaneous breathing and respiratory function stable Cardiovascular status: blood pressure returned to baseline and stable Postop Assessment: no headache and no apparent nausea or vomiting Anesthetic complications: no Comments: Late entry   No notable events documented.   Last Vitals:  Vitals:   07/12/21 0647 07/12/21 0758  BP: (!) 153/88 (!) 96/56  Resp: 10 (!) 22  Temp: 36.8 C 36.8 C  SpO2: 96% 95%    Last Pain:  Vitals:   07/12/21 0758  TempSrc: Oral  PainSc: 0-No pain                 Louann Sjogren

## 2021-07-13 ENCOUNTER — Encounter (HOSPITAL_COMMUNITY): Payer: Self-pay | Admitting: Internal Medicine

## 2021-07-13 LAB — SURGICAL PATHOLOGY

## 2021-07-14 ENCOUNTER — Encounter: Payer: Self-pay | Admitting: Internal Medicine

## 2021-07-18 DIAGNOSIS — I10 Essential (primary) hypertension: Secondary | ICD-10-CM | POA: Diagnosis not present

## 2021-07-18 DIAGNOSIS — E785 Hyperlipidemia, unspecified: Secondary | ICD-10-CM | POA: Diagnosis not present

## 2021-08-04 DIAGNOSIS — X32XXXD Exposure to sunlight, subsequent encounter: Secondary | ICD-10-CM | POA: Diagnosis not present

## 2021-08-04 DIAGNOSIS — D225 Melanocytic nevi of trunk: Secondary | ICD-10-CM | POA: Diagnosis not present

## 2021-08-04 DIAGNOSIS — L570 Actinic keratosis: Secondary | ICD-10-CM | POA: Diagnosis not present

## 2021-08-04 DIAGNOSIS — Z1283 Encounter for screening for malignant neoplasm of skin: Secondary | ICD-10-CM | POA: Diagnosis not present

## 2021-10-06 IMAGING — DX DG CHEST 1V PORT
1 series · 1 of 1 positions shown · non-contrast
Comparison: January 01, 2010.

CLINICAL DATA: Chest pain.

EXAM:
PORTABLE CHEST 1 VIEW

[chest ap]
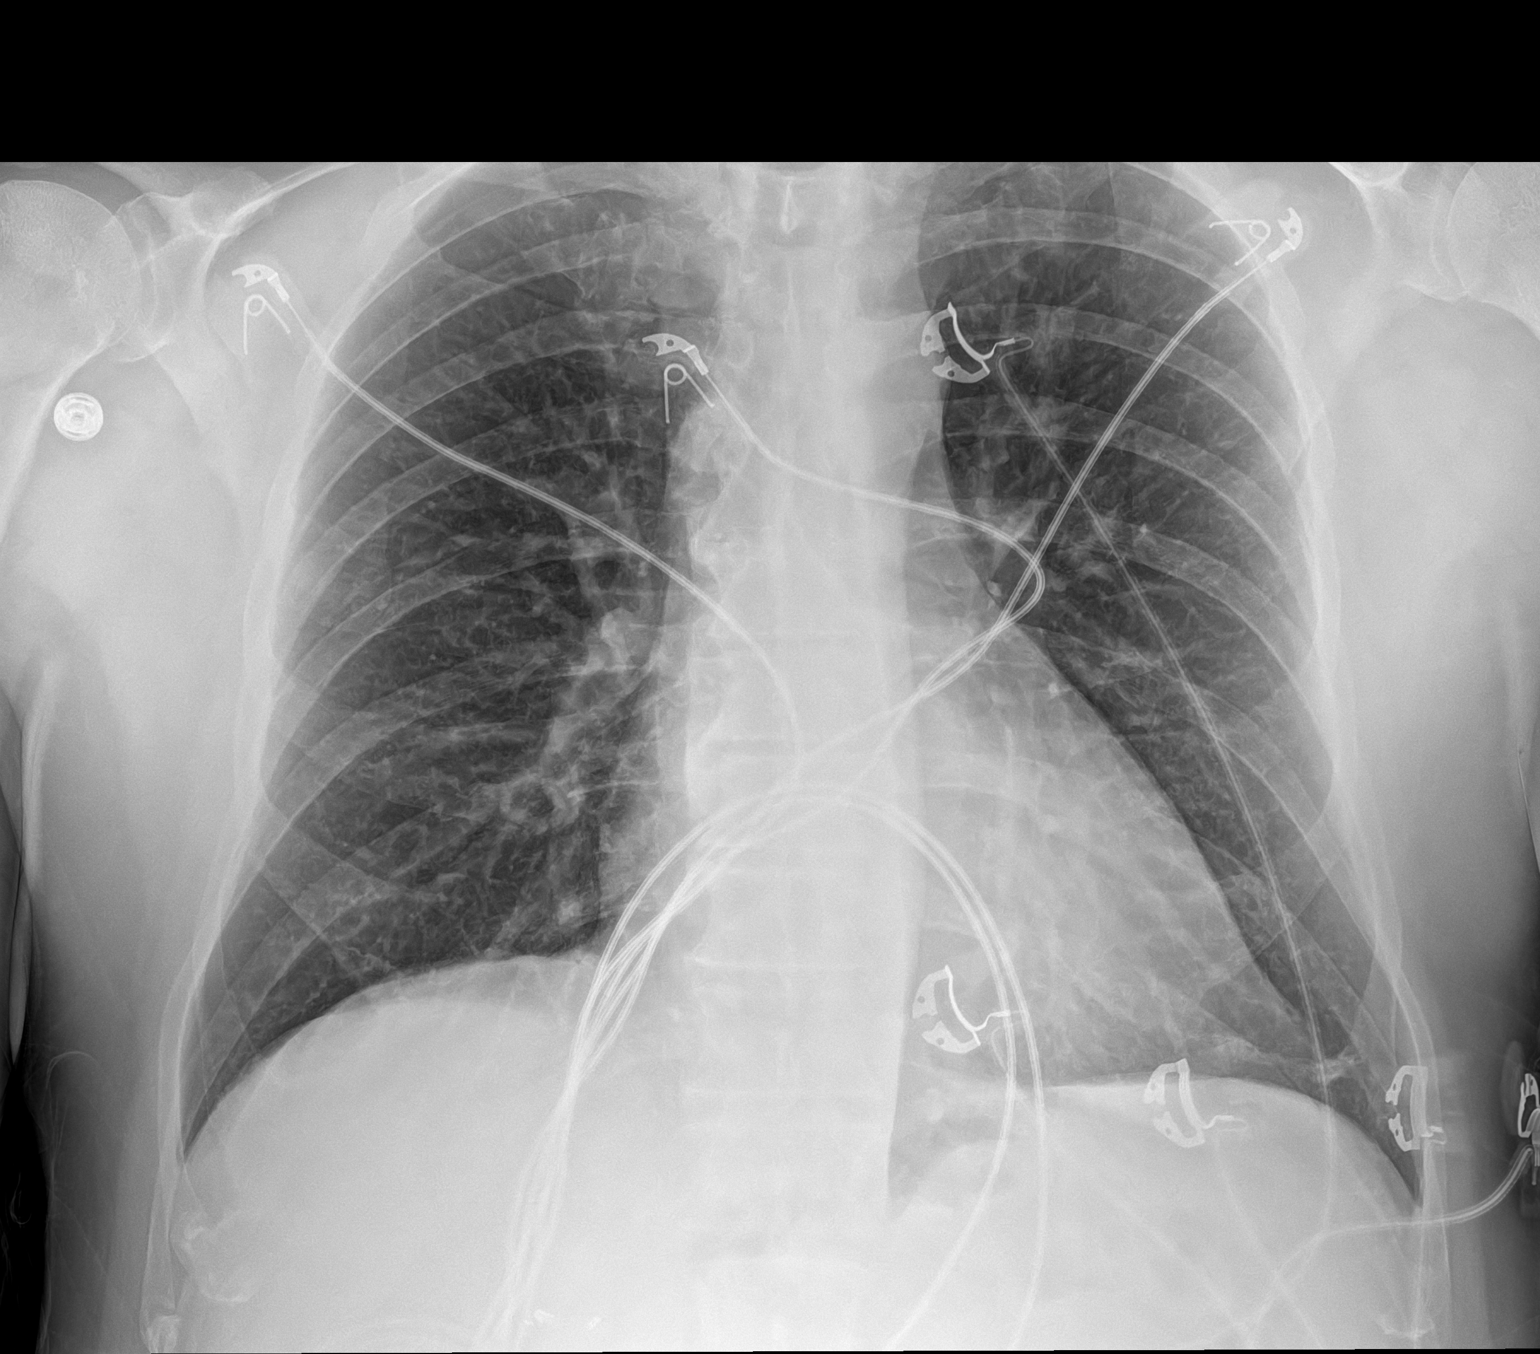

[1 of 1 positions shown; findings below may reference images not displayed]

FINDINGS: Cardiac silhouette is likely similar to prior and is accentuated by
portable AP technique. Both lungs are clear. No visible pleural
effusions or pneumothorax. No acute osseous abnormality.
IMPRESSION: No active disease.

## 2021-10-13 DIAGNOSIS — H3500 Unspecified background retinopathy: Secondary | ICD-10-CM | POA: Diagnosis not present

## 2021-10-13 DIAGNOSIS — H524 Presbyopia: Secondary | ICD-10-CM | POA: Diagnosis not present

## 2021-10-14 DIAGNOSIS — I10 Essential (primary) hypertension: Secondary | ICD-10-CM | POA: Diagnosis not present

## 2021-10-14 DIAGNOSIS — E1122 Type 2 diabetes mellitus with diabetic chronic kidney disease: Secondary | ICD-10-CM | POA: Diagnosis not present

## 2021-10-22 DIAGNOSIS — E1129 Type 2 diabetes mellitus with other diabetic kidney complication: Secondary | ICD-10-CM | POA: Diagnosis not present

## 2022-03-01 DIAGNOSIS — N200 Calculus of kidney: Secondary | ICD-10-CM | POA: Diagnosis not present

## 2022-03-16 DIAGNOSIS — C61 Malignant neoplasm of prostate: Secondary | ICD-10-CM | POA: Diagnosis not present

## 2022-03-16 DIAGNOSIS — R82994 Hypercalciuria: Secondary | ICD-10-CM | POA: Diagnosis not present

## 2022-03-16 DIAGNOSIS — R82991 Hypocitraturia: Secondary | ICD-10-CM | POA: Diagnosis not present

## 2022-03-16 DIAGNOSIS — N200 Calculus of kidney: Secondary | ICD-10-CM | POA: Diagnosis not present

## 2022-03-17 DIAGNOSIS — D485 Neoplasm of uncertain behavior of skin: Secondary | ICD-10-CM | POA: Diagnosis not present

## 2022-03-17 DIAGNOSIS — D225 Melanocytic nevi of trunk: Secondary | ICD-10-CM | POA: Diagnosis not present

## 2022-03-17 DIAGNOSIS — Z1283 Encounter for screening for malignant neoplasm of skin: Secondary | ICD-10-CM | POA: Diagnosis not present

## 2022-04-04 DIAGNOSIS — D485 Neoplasm of uncertain behavior of skin: Secondary | ICD-10-CM | POA: Diagnosis not present

## 2022-04-04 DIAGNOSIS — L988 Other specified disorders of the skin and subcutaneous tissue: Secondary | ICD-10-CM | POA: Diagnosis not present

## 2022-04-20 DIAGNOSIS — Z125 Encounter for screening for malignant neoplasm of prostate: Secondary | ICD-10-CM | POA: Diagnosis not present

## 2022-04-20 DIAGNOSIS — E785 Hyperlipidemia, unspecified: Secondary | ICD-10-CM | POA: Diagnosis not present

## 2022-04-20 DIAGNOSIS — C61 Malignant neoplasm of prostate: Secondary | ICD-10-CM | POA: Diagnosis not present

## 2022-04-20 DIAGNOSIS — Z79899 Other long term (current) drug therapy: Secondary | ICD-10-CM | POA: Diagnosis not present

## 2022-04-20 DIAGNOSIS — K219 Gastro-esophageal reflux disease without esophagitis: Secondary | ICD-10-CM | POA: Diagnosis not present

## 2022-04-20 DIAGNOSIS — R7301 Impaired fasting glucose: Secondary | ICD-10-CM | POA: Diagnosis not present

## 2022-04-20 DIAGNOSIS — I10 Essential (primary) hypertension: Secondary | ICD-10-CM | POA: Diagnosis not present

## 2022-04-20 DIAGNOSIS — E118 Type 2 diabetes mellitus with unspecified complications: Secondary | ICD-10-CM | POA: Diagnosis not present

## 2022-04-21 DIAGNOSIS — I480 Paroxysmal atrial fibrillation: Secondary | ICD-10-CM | POA: Diagnosis not present

## 2022-04-21 DIAGNOSIS — E785 Hyperlipidemia, unspecified: Secondary | ICD-10-CM | POA: Diagnosis not present

## 2022-04-21 DIAGNOSIS — Z8546 Personal history of malignant neoplasm of prostate: Secondary | ICD-10-CM | POA: Diagnosis not present

## 2022-04-21 DIAGNOSIS — Z23 Encounter for immunization: Secondary | ICD-10-CM | POA: Diagnosis not present

## 2022-04-21 DIAGNOSIS — I10 Essential (primary) hypertension: Secondary | ICD-10-CM | POA: Diagnosis not present

## 2022-04-21 DIAGNOSIS — E1122 Type 2 diabetes mellitus with diabetic chronic kidney disease: Secondary | ICD-10-CM | POA: Diagnosis not present

## 2022-04-21 DIAGNOSIS — R7309 Other abnormal glucose: Secondary | ICD-10-CM | POA: Diagnosis not present

## 2022-06-24 DIAGNOSIS — R590 Localized enlarged lymph nodes: Secondary | ICD-10-CM | POA: Diagnosis not present

## 2022-07-11 ENCOUNTER — Other Ambulatory Visit (HOSPITAL_COMMUNITY): Payer: Self-pay | Admitting: Internal Medicine

## 2022-07-11 DIAGNOSIS — I899 Noninfective disorder of lymphatic vessels and lymph nodes, unspecified: Secondary | ICD-10-CM

## 2022-08-04 ENCOUNTER — Ambulatory Visit (HOSPITAL_COMMUNITY)
Admission: RE | Admit: 2022-08-04 | Discharge: 2022-08-04 | Disposition: A | Discharge status: Home / Self Care | Payer: Medicare PPO | Source: Ambulatory Visit | Attending: Internal Medicine | Admitting: Internal Medicine

## 2022-08-04 DIAGNOSIS — I899 Noninfective disorder of lymphatic vessels and lymph nodes, unspecified: Secondary | ICD-10-CM | POA: Diagnosis not present

## 2022-08-04 DIAGNOSIS — R221 Localized swelling, mass and lump, neck: Secondary | ICD-10-CM | POA: Diagnosis not present

## 2022-08-04 MED ORDER — IOHEXOL 300 MG/ML  SOLN
75.0000 mL | Freq: Once | INTRAMUSCULAR | Status: AC | PRN
  Administered 2022-08-04: 75 mL via INTRAVENOUS

## 2022-08-25 DIAGNOSIS — R590 Localized enlarged lymph nodes: Secondary | ICD-10-CM | POA: Diagnosis not present

## 2022-10-12 DIAGNOSIS — E1129 Type 2 diabetes mellitus with other diabetic kidney complication: Secondary | ICD-10-CM | POA: Diagnosis not present

## 2022-10-20 DIAGNOSIS — E1122 Type 2 diabetes mellitus with diabetic chronic kidney disease: Secondary | ICD-10-CM | POA: Diagnosis not present

## 2022-10-20 DIAGNOSIS — R7309 Other abnormal glucose: Secondary | ICD-10-CM | POA: Diagnosis not present

## 2022-10-20 DIAGNOSIS — R590 Localized enlarged lymph nodes: Secondary | ICD-10-CM | POA: Diagnosis not present

## 2022-10-20 DIAGNOSIS — I10 Essential (primary) hypertension: Secondary | ICD-10-CM | POA: Diagnosis not present

## 2022-11-02 DIAGNOSIS — L820 Inflamed seborrheic keratosis: Secondary | ICD-10-CM | POA: Diagnosis not present

## 2022-11-02 DIAGNOSIS — L720 Epidermal cyst: Secondary | ICD-10-CM | POA: Diagnosis not present

## 2022-11-02 DIAGNOSIS — L821 Other seborrheic keratosis: Secondary | ICD-10-CM | POA: Diagnosis not present

## 2022-11-02 DIAGNOSIS — D225 Melanocytic nevi of trunk: Secondary | ICD-10-CM | POA: Diagnosis not present

## 2022-11-02 DIAGNOSIS — Z1283 Encounter for screening for malignant neoplasm of skin: Secondary | ICD-10-CM | POA: Diagnosis not present

## 2022-11-09 DIAGNOSIS — R590 Localized enlarged lymph nodes: Secondary | ICD-10-CM | POA: Diagnosis not present

## 2023-01-31 DIAGNOSIS — H2513 Age-related nuclear cataract, bilateral: Secondary | ICD-10-CM | POA: Diagnosis not present

## 2023-01-31 DIAGNOSIS — H524 Presbyopia: Secondary | ICD-10-CM | POA: Diagnosis not present

## 2023-02-08 DIAGNOSIS — R590 Localized enlarged lymph nodes: Secondary | ICD-10-CM | POA: Diagnosis not present

## 2023-02-17 ENCOUNTER — Other Ambulatory Visit: Payer: Self-pay | Admitting: Otolaryngology

## 2023-02-17 DIAGNOSIS — R590 Localized enlarged lymph nodes: Secondary | ICD-10-CM | POA: Diagnosis not present

## 2023-02-17 DIAGNOSIS — C801 Malignant (primary) neoplasm, unspecified: Secondary | ICD-10-CM | POA: Diagnosis not present

## 2023-02-17 DIAGNOSIS — C770 Secondary and unspecified malignant neoplasm of lymph nodes of head, face and neck: Secondary | ICD-10-CM | POA: Diagnosis not present

## 2023-02-28 ENCOUNTER — Other Ambulatory Visit (HOSPITAL_COMMUNITY): Payer: Self-pay | Admitting: Otolaryngology

## 2023-02-28 DIAGNOSIS — C439 Malignant melanoma of skin, unspecified: Secondary | ICD-10-CM

## 2023-03-02 DIAGNOSIS — Z85820 Personal history of malignant melanoma of skin: Secondary | ICD-10-CM | POA: Diagnosis not present

## 2023-03-02 DIAGNOSIS — D225 Melanocytic nevi of trunk: Secondary | ICD-10-CM | POA: Diagnosis not present

## 2023-03-02 DIAGNOSIS — L821 Other seborrheic keratosis: Secondary | ICD-10-CM | POA: Diagnosis not present

## 2023-03-02 DIAGNOSIS — Z1283 Encounter for screening for malignant neoplasm of skin: Secondary | ICD-10-CM | POA: Diagnosis not present

## 2023-03-02 DIAGNOSIS — Z08 Encounter for follow-up examination after completed treatment for malignant neoplasm: Secondary | ICD-10-CM | POA: Diagnosis not present

## 2023-03-02 DIAGNOSIS — C4441 Basal cell carcinoma of skin of scalp and neck: Secondary | ICD-10-CM | POA: Diagnosis not present

## 2023-03-03 ENCOUNTER — Ambulatory Visit (HOSPITAL_COMMUNITY)
Admission: RE | Admit: 2023-03-03 | Discharge: 2023-03-03 | Disposition: A | Discharge status: Home / Self Care | Payer: Medicare PPO | Source: Ambulatory Visit | Attending: Otolaryngology | Admitting: Otolaryngology

## 2023-03-03 DIAGNOSIS — C439 Malignant melanoma of skin, unspecified: Secondary | ICD-10-CM | POA: Insufficient documentation

## 2023-03-03 LAB — GLUCOSE, CAPILLARY: Glucose-Capillary: 138 mg/dL — ABNORMAL HIGH (ref 70–99)

## 2023-03-03 MED ORDER — FLUDEOXYGLUCOSE F - 18 (FDG) INJECTION
9.3000 | Freq: Once | INTRAVENOUS | Status: AC | PRN
  Administered 2023-03-03: 9.3 via INTRAVENOUS

## 2023-03-07 DIAGNOSIS — C439 Malignant melanoma of skin, unspecified: Secondary | ICD-10-CM | POA: Diagnosis not present

## 2023-03-17 ENCOUNTER — Telehealth: Payer: Self-pay

## 2023-03-17 NOTE — Telephone Encounter (Signed)
Patient called to cancel 9/30 appointments and stated he is not interested in seeing a provider here. Cancelled appointments and patient aware.

## 2023-03-20 ENCOUNTER — Inpatient Hospital Stay: Payer: Medicare PPO

## 2023-03-20 DIAGNOSIS — I2510 Atherosclerotic heart disease of native coronary artery without angina pectoris: Secondary | ICD-10-CM | POA: Diagnosis not present

## 2023-03-20 DIAGNOSIS — Z803 Family history of malignant neoplasm of breast: Secondary | ICD-10-CM | POA: Diagnosis not present

## 2023-03-20 DIAGNOSIS — Z79899 Other long term (current) drug therapy: Secondary | ICD-10-CM | POA: Diagnosis not present

## 2023-03-20 DIAGNOSIS — C799 Secondary malignant neoplasm of unspecified site: Secondary | ICD-10-CM | POA: Diagnosis not present

## 2023-03-20 DIAGNOSIS — C439 Malignant melanoma of skin, unspecified: Secondary | ICD-10-CM | POA: Diagnosis not present

## 2023-03-21 DIAGNOSIS — D485 Neoplasm of uncertain behavior of skin: Secondary | ICD-10-CM | POA: Diagnosis not present

## 2023-03-24 DIAGNOSIS — C434 Malignant melanoma of scalp and neck: Secondary | ICD-10-CM | POA: Diagnosis not present

## 2023-03-24 DIAGNOSIS — C439 Malignant melanoma of skin, unspecified: Secondary | ICD-10-CM | POA: Diagnosis not present

## 2023-03-24 DIAGNOSIS — R590 Localized enlarged lymph nodes: Secondary | ICD-10-CM | POA: Diagnosis not present

## 2023-03-29 DIAGNOSIS — C439 Malignant melanoma of skin, unspecified: Secondary | ICD-10-CM | POA: Diagnosis not present

## 2023-04-03 DIAGNOSIS — C434 Malignant melanoma of scalp and neck: Secondary | ICD-10-CM | POA: Diagnosis not present

## 2023-04-03 DIAGNOSIS — C770 Secondary and unspecified malignant neoplasm of lymph nodes of head, face and neck: Secondary | ICD-10-CM | POA: Diagnosis not present

## 2023-04-03 DIAGNOSIS — R590 Localized enlarged lymph nodes: Secondary | ICD-10-CM | POA: Diagnosis not present

## 2023-04-03 DIAGNOSIS — C799 Secondary malignant neoplasm of unspecified site: Secondary | ICD-10-CM | POA: Diagnosis not present

## 2023-04-03 DIAGNOSIS — C439 Malignant melanoma of skin, unspecified: Secondary | ICD-10-CM | POA: Diagnosis not present

## 2023-04-04 DIAGNOSIS — C434 Malignant melanoma of scalp and neck: Secondary | ICD-10-CM | POA: Diagnosis not present

## 2023-04-04 DIAGNOSIS — Z79899 Other long term (current) drug therapy: Secondary | ICD-10-CM | POA: Diagnosis not present

## 2023-04-04 DIAGNOSIS — Z7982 Long term (current) use of aspirin: Secondary | ICD-10-CM | POA: Diagnosis not present

## 2023-04-04 DIAGNOSIS — C439 Malignant melanoma of skin, unspecified: Secondary | ICD-10-CM | POA: Diagnosis not present

## 2023-04-04 DIAGNOSIS — Z5112 Encounter for antineoplastic immunotherapy: Secondary | ICD-10-CM | POA: Diagnosis not present

## 2023-04-20 DIAGNOSIS — X32XXXD Exposure to sunlight, subsequent encounter: Secondary | ICD-10-CM | POA: Diagnosis not present

## 2023-04-20 DIAGNOSIS — Z85828 Personal history of other malignant neoplasm of skin: Secondary | ICD-10-CM | POA: Diagnosis not present

## 2023-04-20 DIAGNOSIS — Z08 Encounter for follow-up examination after completed treatment for malignant neoplasm: Secondary | ICD-10-CM | POA: Diagnosis not present

## 2023-04-20 DIAGNOSIS — L570 Actinic keratosis: Secondary | ICD-10-CM | POA: Diagnosis not present

## 2023-04-20 DIAGNOSIS — B078 Other viral warts: Secondary | ICD-10-CM | POA: Diagnosis not present

## 2023-04-25 DIAGNOSIS — Z5112 Encounter for antineoplastic immunotherapy: Secondary | ICD-10-CM | POA: Diagnosis not present

## 2023-04-25 DIAGNOSIS — Z79899 Other long term (current) drug therapy: Secondary | ICD-10-CM | POA: Diagnosis not present

## 2023-04-25 DIAGNOSIS — C439 Malignant melanoma of skin, unspecified: Secondary | ICD-10-CM | POA: Diagnosis not present

## 2023-05-10 DIAGNOSIS — N200 Calculus of kidney: Secondary | ICD-10-CM | POA: Diagnosis not present

## 2023-05-10 DIAGNOSIS — K219 Gastro-esophageal reflux disease without esophagitis: Secondary | ICD-10-CM | POA: Diagnosis not present

## 2023-05-10 DIAGNOSIS — E785 Hyperlipidemia, unspecified: Secondary | ICD-10-CM | POA: Diagnosis not present

## 2023-05-10 DIAGNOSIS — C61 Malignant neoplasm of prostate: Secondary | ICD-10-CM | POA: Diagnosis not present

## 2023-05-10 DIAGNOSIS — I10 Essential (primary) hypertension: Secondary | ICD-10-CM | POA: Diagnosis not present

## 2023-05-10 DIAGNOSIS — E1129 Type 2 diabetes mellitus with other diabetic kidney complication: Secondary | ICD-10-CM | POA: Diagnosis not present

## 2023-05-10 DIAGNOSIS — Z79899 Other long term (current) drug therapy: Secondary | ICD-10-CM | POA: Diagnosis not present

## 2023-05-10 DIAGNOSIS — Z125 Encounter for screening for malignant neoplasm of prostate: Secondary | ICD-10-CM | POA: Diagnosis not present

## 2023-05-15 DIAGNOSIS — Z79899 Other long term (current) drug therapy: Secondary | ICD-10-CM | POA: Diagnosis not present

## 2023-05-15 DIAGNOSIS — Z5112 Encounter for antineoplastic immunotherapy: Secondary | ICD-10-CM | POA: Diagnosis not present

## 2023-05-15 DIAGNOSIS — R4589 Other symptoms and signs involving emotional state: Secondary | ICD-10-CM | POA: Diagnosis not present

## 2023-05-15 DIAGNOSIS — C439 Malignant melanoma of skin, unspecified: Secondary | ICD-10-CM | POA: Diagnosis not present

## 2023-05-17 DIAGNOSIS — E785 Hyperlipidemia, unspecified: Secondary | ICD-10-CM | POA: Diagnosis not present

## 2023-05-17 DIAGNOSIS — I10 Essential (primary) hypertension: Secondary | ICD-10-CM | POA: Diagnosis not present

## 2023-05-17 DIAGNOSIS — E1169 Type 2 diabetes mellitus with other specified complication: Secondary | ICD-10-CM | POA: Diagnosis not present

## 2023-05-17 DIAGNOSIS — I491 Atrial premature depolarization: Secondary | ICD-10-CM | POA: Diagnosis not present

## 2023-05-17 DIAGNOSIS — Z23 Encounter for immunization: Secondary | ICD-10-CM | POA: Diagnosis not present

## 2023-05-17 DIAGNOSIS — Z0001 Encounter for general adult medical examination with abnormal findings: Secondary | ICD-10-CM | POA: Diagnosis not present

## 2023-05-17 DIAGNOSIS — C439 Malignant melanoma of skin, unspecified: Secondary | ICD-10-CM | POA: Diagnosis not present

## 2023-05-29 DIAGNOSIS — C434 Malignant melanoma of scalp and neck: Secondary | ICD-10-CM | POA: Diagnosis not present

## 2023-06-01 DIAGNOSIS — C439 Malignant melanoma of skin, unspecified: Secondary | ICD-10-CM | POA: Diagnosis not present

## 2023-06-12 DIAGNOSIS — C434 Malignant melanoma of scalp and neck: Secondary | ICD-10-CM | POA: Diagnosis not present

## 2023-06-12 DIAGNOSIS — N200 Calculus of kidney: Secondary | ICD-10-CM | POA: Diagnosis not present

## 2023-06-12 DIAGNOSIS — E785 Hyperlipidemia, unspecified: Secondary | ICD-10-CM | POA: Diagnosis not present

## 2023-06-12 DIAGNOSIS — C439 Malignant melanoma of skin, unspecified: Secondary | ICD-10-CM | POA: Diagnosis not present

## 2023-06-12 DIAGNOSIS — I2510 Atherosclerotic heart disease of native coronary artery without angina pectoris: Secondary | ICD-10-CM | POA: Diagnosis not present

## 2023-06-12 DIAGNOSIS — C770 Secondary and unspecified malignant neoplasm of lymph nodes of head, face and neck: Secondary | ICD-10-CM | POA: Diagnosis not present

## 2023-06-12 DIAGNOSIS — K219 Gastro-esophageal reflux disease without esophagitis: Secondary | ICD-10-CM | POA: Diagnosis not present

## 2023-06-12 DIAGNOSIS — I10 Essential (primary) hypertension: Secondary | ICD-10-CM | POA: Diagnosis not present

## 2023-06-12 DIAGNOSIS — C779 Secondary and unspecified malignant neoplasm of lymph node, unspecified: Secondary | ICD-10-CM | POA: Diagnosis not present

## 2023-06-27 DIAGNOSIS — C439 Malignant melanoma of skin, unspecified: Secondary | ICD-10-CM | POA: Diagnosis not present

## 2023-06-27 DIAGNOSIS — C434 Malignant melanoma of scalp and neck: Secondary | ICD-10-CM | POA: Diagnosis not present

## 2023-06-27 DIAGNOSIS — Z79899 Other long term (current) drug therapy: Secondary | ICD-10-CM | POA: Diagnosis not present

## 2023-06-30 DIAGNOSIS — C439 Malignant melanoma of skin, unspecified: Secondary | ICD-10-CM | POA: Diagnosis not present

## 2023-06-30 DIAGNOSIS — Z09 Encounter for follow-up examination after completed treatment for conditions other than malignant neoplasm: Secondary | ICD-10-CM | POA: Diagnosis not present

## 2023-07-19 DIAGNOSIS — C439 Malignant melanoma of skin, unspecified: Secondary | ICD-10-CM | POA: Diagnosis not present

## 2023-07-19 DIAGNOSIS — M6281 Muscle weakness (generalized): Secondary | ICD-10-CM | POA: Diagnosis not present

## 2023-07-19 DIAGNOSIS — M25511 Pain in right shoulder: Secondary | ICD-10-CM | POA: Diagnosis not present

## 2023-07-19 DIAGNOSIS — M542 Cervicalgia: Secondary | ICD-10-CM | POA: Diagnosis not present

## 2023-07-20 DIAGNOSIS — L240 Irritant contact dermatitis due to detergents: Secondary | ICD-10-CM | POA: Diagnosis not present

## 2023-07-24 DIAGNOSIS — C439 Malignant melanoma of skin, unspecified: Secondary | ICD-10-CM | POA: Diagnosis not present

## 2023-07-24 DIAGNOSIS — M6281 Muscle weakness (generalized): Secondary | ICD-10-CM | POA: Diagnosis not present

## 2023-07-24 DIAGNOSIS — M25511 Pain in right shoulder: Secondary | ICD-10-CM | POA: Diagnosis not present

## 2023-07-24 DIAGNOSIS — M542 Cervicalgia: Secondary | ICD-10-CM | POA: Diagnosis not present

## 2023-07-25 DIAGNOSIS — C439 Malignant melanoma of skin, unspecified: Secondary | ICD-10-CM | POA: Diagnosis not present

## 2023-07-25 DIAGNOSIS — Z5112 Encounter for antineoplastic immunotherapy: Secondary | ICD-10-CM | POA: Diagnosis not present

## 2023-07-25 DIAGNOSIS — Z79899 Other long term (current) drug therapy: Secondary | ICD-10-CM | POA: Diagnosis not present

## 2023-07-31 DIAGNOSIS — M25511 Pain in right shoulder: Secondary | ICD-10-CM | POA: Diagnosis not present

## 2023-07-31 DIAGNOSIS — M6281 Muscle weakness (generalized): Secondary | ICD-10-CM | POA: Diagnosis not present

## 2023-07-31 DIAGNOSIS — C439 Malignant melanoma of skin, unspecified: Secondary | ICD-10-CM | POA: Diagnosis not present

## 2023-07-31 DIAGNOSIS — M542 Cervicalgia: Secondary | ICD-10-CM | POA: Diagnosis not present

## 2023-08-03 DIAGNOSIS — M542 Cervicalgia: Secondary | ICD-10-CM | POA: Diagnosis not present

## 2023-08-03 DIAGNOSIS — M6281 Muscle weakness (generalized): Secondary | ICD-10-CM | POA: Diagnosis not present

## 2023-08-03 DIAGNOSIS — C439 Malignant melanoma of skin, unspecified: Secondary | ICD-10-CM | POA: Diagnosis not present

## 2023-08-03 DIAGNOSIS — M25511 Pain in right shoulder: Secondary | ICD-10-CM | POA: Diagnosis not present

## 2023-08-07 DIAGNOSIS — C439 Malignant melanoma of skin, unspecified: Secondary | ICD-10-CM | POA: Diagnosis not present

## 2023-08-07 DIAGNOSIS — M542 Cervicalgia: Secondary | ICD-10-CM | POA: Diagnosis not present

## 2023-08-07 DIAGNOSIS — M25511 Pain in right shoulder: Secondary | ICD-10-CM | POA: Diagnosis not present

## 2023-08-07 DIAGNOSIS — M6281 Muscle weakness (generalized): Secondary | ICD-10-CM | POA: Diagnosis not present

## 2023-08-14 DIAGNOSIS — Z1283 Encounter for screening for malignant neoplasm of skin: Secondary | ICD-10-CM | POA: Diagnosis not present

## 2023-08-14 DIAGNOSIS — D2261 Melanocytic nevi of right upper limb, including shoulder: Secondary | ICD-10-CM | POA: Diagnosis not present

## 2023-08-14 DIAGNOSIS — D225 Melanocytic nevi of trunk: Secondary | ICD-10-CM | POA: Diagnosis not present

## 2023-08-14 DIAGNOSIS — L308 Other specified dermatitis: Secondary | ICD-10-CM | POA: Diagnosis not present

## 2023-08-14 DIAGNOSIS — L820 Inflamed seborrheic keratosis: Secondary | ICD-10-CM | POA: Diagnosis not present

## 2023-08-14 DIAGNOSIS — D485 Neoplasm of uncertain behavior of skin: Secondary | ICD-10-CM | POA: Diagnosis not present

## 2023-08-14 DIAGNOSIS — Z08 Encounter for follow-up examination after completed treatment for malignant neoplasm: Secondary | ICD-10-CM | POA: Diagnosis not present

## 2023-08-14 DIAGNOSIS — Z85820 Personal history of malignant melanoma of skin: Secondary | ICD-10-CM | POA: Diagnosis not present

## 2023-08-15 DIAGNOSIS — Z5112 Encounter for antineoplastic immunotherapy: Secondary | ICD-10-CM | POA: Diagnosis not present

## 2023-08-15 DIAGNOSIS — Z79899 Other long term (current) drug therapy: Secondary | ICD-10-CM | POA: Diagnosis not present

## 2023-08-15 DIAGNOSIS — R21 Rash and other nonspecific skin eruption: Secondary | ICD-10-CM | POA: Diagnosis not present

## 2023-08-15 DIAGNOSIS — F419 Anxiety disorder, unspecified: Secondary | ICD-10-CM | POA: Diagnosis not present

## 2023-08-15 DIAGNOSIS — C439 Malignant melanoma of skin, unspecified: Secondary | ICD-10-CM | POA: Diagnosis not present

## 2023-08-21 DIAGNOSIS — D485 Neoplasm of uncertain behavior of skin: Secondary | ICD-10-CM | POA: Diagnosis not present

## 2023-08-21 DIAGNOSIS — L820 Inflamed seborrheic keratosis: Secondary | ICD-10-CM | POA: Diagnosis not present

## 2023-08-21 DIAGNOSIS — D2272 Melanocytic nevi of left lower limb, including hip: Secondary | ICD-10-CM | POA: Diagnosis not present

## 2023-08-21 DIAGNOSIS — L988 Other specified disorders of the skin and subcutaneous tissue: Secondary | ICD-10-CM | POA: Diagnosis not present

## 2023-09-05 DIAGNOSIS — Z5112 Encounter for antineoplastic immunotherapy: Secondary | ICD-10-CM | POA: Diagnosis not present

## 2023-09-05 DIAGNOSIS — Z79899 Other long term (current) drug therapy: Secondary | ICD-10-CM | POA: Diagnosis not present

## 2023-09-05 DIAGNOSIS — C439 Malignant melanoma of skin, unspecified: Secondary | ICD-10-CM | POA: Diagnosis not present

## 2023-09-06 DIAGNOSIS — C434 Malignant melanoma of scalp and neck: Secondary | ICD-10-CM | POA: Diagnosis not present

## 2023-09-26 DIAGNOSIS — F32A Depression, unspecified: Secondary | ICD-10-CM | POA: Diagnosis not present

## 2023-09-26 DIAGNOSIS — F419 Anxiety disorder, unspecified: Secondary | ICD-10-CM | POA: Diagnosis not present

## 2023-09-26 DIAGNOSIS — E860 Dehydration: Secondary | ICD-10-CM | POA: Diagnosis not present

## 2023-09-26 DIAGNOSIS — M25511 Pain in right shoulder: Secondary | ICD-10-CM | POA: Diagnosis not present

## 2023-09-26 DIAGNOSIS — C439 Malignant melanoma of skin, unspecified: Secondary | ICD-10-CM | POA: Diagnosis not present

## 2023-09-26 DIAGNOSIS — Z7982 Long term (current) use of aspirin: Secondary | ICD-10-CM | POA: Diagnosis not present

## 2023-09-26 DIAGNOSIS — Z5112 Encounter for antineoplastic immunotherapy: Secondary | ICD-10-CM | POA: Diagnosis not present

## 2023-09-26 DIAGNOSIS — Z79899 Other long term (current) drug therapy: Secondary | ICD-10-CM | POA: Diagnosis not present

## 2023-10-12 DIAGNOSIS — E118 Type 2 diabetes mellitus with unspecified complications: Secondary | ICD-10-CM | POA: Diagnosis not present

## 2023-10-14 DIAGNOSIS — K8689 Other specified diseases of pancreas: Secondary | ICD-10-CM | POA: Diagnosis not present

## 2023-10-14 DIAGNOSIS — C4361 Malignant melanoma of right upper limb, including shoulder: Secondary | ICD-10-CM | POA: Diagnosis not present

## 2023-10-14 DIAGNOSIS — N200 Calculus of kidney: Secondary | ICD-10-CM | POA: Diagnosis not present

## 2023-10-14 DIAGNOSIS — N289 Disorder of kidney and ureter, unspecified: Secondary | ICD-10-CM | POA: Diagnosis not present

## 2023-10-17 DIAGNOSIS — R590 Localized enlarged lymph nodes: Secondary | ICD-10-CM | POA: Diagnosis not present

## 2023-10-17 DIAGNOSIS — C439 Malignant melanoma of skin, unspecified: Secondary | ICD-10-CM | POA: Diagnosis not present

## 2023-10-17 DIAGNOSIS — Z5112 Encounter for antineoplastic immunotherapy: Secondary | ICD-10-CM | POA: Diagnosis not present

## 2023-10-17 DIAGNOSIS — Z79899 Other long term (current) drug therapy: Secondary | ICD-10-CM | POA: Diagnosis not present

## 2023-10-18 DIAGNOSIS — E1169 Type 2 diabetes mellitus with other specified complication: Secondary | ICD-10-CM | POA: Diagnosis not present

## 2023-10-18 DIAGNOSIS — I10 Essential (primary) hypertension: Secondary | ICD-10-CM | POA: Diagnosis not present

## 2023-11-07 DIAGNOSIS — Z79899 Other long term (current) drug therapy: Secondary | ICD-10-CM | POA: Diagnosis not present

## 2023-11-07 DIAGNOSIS — C434 Malignant melanoma of scalp and neck: Secondary | ICD-10-CM | POA: Diagnosis not present

## 2023-11-07 DIAGNOSIS — Z5112 Encounter for antineoplastic immunotherapy: Secondary | ICD-10-CM | POA: Diagnosis not present

## 2023-11-07 DIAGNOSIS — C439 Malignant melanoma of skin, unspecified: Secondary | ICD-10-CM | POA: Diagnosis not present

## 2023-12-04 DIAGNOSIS — D485 Neoplasm of uncertain behavior of skin: Secondary | ICD-10-CM | POA: Diagnosis not present

## 2023-12-04 DIAGNOSIS — Z1283 Encounter for screening for malignant neoplasm of skin: Secondary | ICD-10-CM | POA: Diagnosis not present

## 2023-12-04 DIAGNOSIS — D225 Melanocytic nevi of trunk: Secondary | ICD-10-CM | POA: Diagnosis not present

## 2023-12-04 DIAGNOSIS — B078 Other viral warts: Secondary | ICD-10-CM | POA: Diagnosis not present

## 2023-12-04 DIAGNOSIS — Z85820 Personal history of malignant melanoma of skin: Secondary | ICD-10-CM | POA: Diagnosis not present

## 2023-12-04 DIAGNOSIS — Z08 Encounter for follow-up examination after completed treatment for malignant neoplasm: Secondary | ICD-10-CM | POA: Diagnosis not present

## 2023-12-05 DIAGNOSIS — C439 Malignant melanoma of skin, unspecified: Secondary | ICD-10-CM | POA: Diagnosis not present

## 2023-12-05 DIAGNOSIS — Z7982 Long term (current) use of aspirin: Secondary | ICD-10-CM | POA: Diagnosis not present

## 2023-12-05 DIAGNOSIS — Z79899 Other long term (current) drug therapy: Secondary | ICD-10-CM | POA: Diagnosis not present

## 2023-12-05 DIAGNOSIS — C434 Malignant melanoma of scalp and neck: Secondary | ICD-10-CM | POA: Diagnosis not present

## 2023-12-05 DIAGNOSIS — Z5112 Encounter for antineoplastic immunotherapy: Secondary | ICD-10-CM | POA: Diagnosis not present

## 2023-12-19 DIAGNOSIS — C434 Malignant melanoma of scalp and neck: Secondary | ICD-10-CM | POA: Diagnosis not present

## 2023-12-19 DIAGNOSIS — C439 Malignant melanoma of skin, unspecified: Secondary | ICD-10-CM | POA: Diagnosis not present

## 2023-12-20 DIAGNOSIS — L720 Epidermal cyst: Secondary | ICD-10-CM | POA: Diagnosis not present

## 2023-12-25 DIAGNOSIS — Z79899 Other long term (current) drug therapy: Secondary | ICD-10-CM | POA: Diagnosis not present

## 2023-12-26 DIAGNOSIS — L0501 Pilonidal cyst with abscess: Secondary | ICD-10-CM | POA: Diagnosis not present

## 2023-12-26 DIAGNOSIS — Z79899 Other long term (current) drug therapy: Secondary | ICD-10-CM | POA: Diagnosis not present

## 2023-12-26 DIAGNOSIS — C439 Malignant melanoma of skin, unspecified: Secondary | ICD-10-CM | POA: Diagnosis not present

## 2024-01-09 ENCOUNTER — Telehealth: Payer: Self-pay

## 2024-01-09 DIAGNOSIS — L720 Epidermal cyst: Secondary | ICD-10-CM | POA: Diagnosis not present

## 2024-01-09 NOTE — Telephone Encounter (Signed)
 Up to date on meds, next review in October

## 2024-01-16 DIAGNOSIS — Z79899 Other long term (current) drug therapy: Secondary | ICD-10-CM | POA: Diagnosis not present

## 2024-01-16 DIAGNOSIS — Z5112 Encounter for antineoplastic immunotherapy: Secondary | ICD-10-CM | POA: Diagnosis not present

## 2024-01-16 DIAGNOSIS — C439 Malignant melanoma of skin, unspecified: Secondary | ICD-10-CM | POA: Diagnosis not present

## 2024-01-16 DIAGNOSIS — C4351 Malignant melanoma of anal skin: Secondary | ICD-10-CM | POA: Diagnosis not present

## 2024-02-06 DIAGNOSIS — Z79899 Other long term (current) drug therapy: Secondary | ICD-10-CM | POA: Diagnosis not present

## 2024-02-06 DIAGNOSIS — C439 Malignant melanoma of skin, unspecified: Secondary | ICD-10-CM | POA: Diagnosis not present

## 2024-02-07 DIAGNOSIS — H35033 Hypertensive retinopathy, bilateral: Secondary | ICD-10-CM | POA: Diagnosis not present

## 2024-02-07 DIAGNOSIS — H524 Presbyopia: Secondary | ICD-10-CM | POA: Diagnosis not present

## 2024-02-08 DIAGNOSIS — E118 Type 2 diabetes mellitus with unspecified complications: Secondary | ICD-10-CM | POA: Diagnosis not present

## 2024-02-16 DIAGNOSIS — C439 Malignant melanoma of skin, unspecified: Secondary | ICD-10-CM | POA: Diagnosis not present

## 2024-02-16 DIAGNOSIS — E1169 Type 2 diabetes mellitus with other specified complication: Secondary | ICD-10-CM | POA: Diagnosis not present

## 2024-02-16 DIAGNOSIS — I10 Essential (primary) hypertension: Secondary | ICD-10-CM | POA: Diagnosis not present

## 2024-02-16 DIAGNOSIS — Z79899 Other long term (current) drug therapy: Secondary | ICD-10-CM | POA: Diagnosis not present

## 2024-02-16 DIAGNOSIS — H6123 Impacted cerumen, bilateral: Secondary | ICD-10-CM | POA: Diagnosis not present

## 2024-02-27 DIAGNOSIS — Z5112 Encounter for antineoplastic immunotherapy: Secondary | ICD-10-CM | POA: Diagnosis not present

## 2024-02-27 DIAGNOSIS — Z79899 Other long term (current) drug therapy: Secondary | ICD-10-CM | POA: Diagnosis not present

## 2024-02-27 DIAGNOSIS — C770 Secondary and unspecified malignant neoplasm of lymph nodes of head, face and neck: Secondary | ICD-10-CM | POA: Diagnosis not present

## 2024-02-27 DIAGNOSIS — R5383 Other fatigue: Secondary | ICD-10-CM | POA: Diagnosis not present

## 2024-02-27 DIAGNOSIS — D179 Benign lipomatous neoplasm, unspecified: Secondary | ICD-10-CM | POA: Diagnosis not present

## 2024-02-27 DIAGNOSIS — C439 Malignant melanoma of skin, unspecified: Secondary | ICD-10-CM | POA: Diagnosis not present

## 2024-02-27 DIAGNOSIS — R590 Localized enlarged lymph nodes: Secondary | ICD-10-CM | POA: Diagnosis not present

## 2024-02-27 DIAGNOSIS — Z7982 Long term (current) use of aspirin: Secondary | ICD-10-CM | POA: Diagnosis not present

## 2024-03-07 DIAGNOSIS — C439 Malignant melanoma of skin, unspecified: Secondary | ICD-10-CM | POA: Diagnosis not present

## 2024-03-07 DIAGNOSIS — D225 Melanocytic nevi of trunk: Secondary | ICD-10-CM | POA: Diagnosis not present

## 2024-03-07 DIAGNOSIS — Z1283 Encounter for screening for malignant neoplasm of skin: Secondary | ICD-10-CM | POA: Diagnosis not present

## 2024-03-07 DIAGNOSIS — Z85820 Personal history of malignant melanoma of skin: Secondary | ICD-10-CM | POA: Diagnosis not present

## 2024-03-07 DIAGNOSIS — B078 Other viral warts: Secondary | ICD-10-CM | POA: Diagnosis not present

## 2024-03-07 DIAGNOSIS — C4351 Malignant melanoma of anal skin: Secondary | ICD-10-CM | POA: Diagnosis not present

## 2024-03-07 DIAGNOSIS — D485 Neoplasm of uncertain behavior of skin: Secondary | ICD-10-CM | POA: Diagnosis not present

## 2024-03-07 DIAGNOSIS — Z08 Encounter for follow-up examination after completed treatment for malignant neoplasm: Secondary | ICD-10-CM | POA: Diagnosis not present

## 2024-03-13 DIAGNOSIS — B078 Other viral warts: Secondary | ICD-10-CM | POA: Diagnosis not present

## 2024-03-13 DIAGNOSIS — L918 Other hypertrophic disorders of the skin: Secondary | ICD-10-CM | POA: Diagnosis not present

## 2024-03-19 DIAGNOSIS — Z5112 Encounter for antineoplastic immunotherapy: Secondary | ICD-10-CM | POA: Diagnosis not present

## 2024-03-19 DIAGNOSIS — C439 Malignant melanoma of skin, unspecified: Secondary | ICD-10-CM | POA: Diagnosis not present

## 2024-03-19 DIAGNOSIS — Z79899 Other long term (current) drug therapy: Secondary | ICD-10-CM | POA: Diagnosis not present

## 2024-04-17 DIAGNOSIS — N200 Calculus of kidney: Secondary | ICD-10-CM | POA: Diagnosis not present

## 2024-05-14 DIAGNOSIS — E785 Hyperlipidemia, unspecified: Secondary | ICD-10-CM | POA: Diagnosis not present

## 2024-05-14 DIAGNOSIS — I10 Essential (primary) hypertension: Secondary | ICD-10-CM | POA: Diagnosis not present

## 2024-05-14 DIAGNOSIS — N200 Calculus of kidney: Secondary | ICD-10-CM | POA: Diagnosis not present

## 2024-05-14 DIAGNOSIS — Z79899 Other long term (current) drug therapy: Secondary | ICD-10-CM | POA: Diagnosis not present

## 2024-05-14 DIAGNOSIS — Z125 Encounter for screening for malignant neoplasm of prostate: Secondary | ICD-10-CM | POA: Diagnosis not present

## 2024-05-14 DIAGNOSIS — E1129 Type 2 diabetes mellitus with other diabetic kidney complication: Secondary | ICD-10-CM | POA: Diagnosis not present

## 2024-05-14 DIAGNOSIS — C61 Malignant neoplasm of prostate: Secondary | ICD-10-CM | POA: Diagnosis not present

## 2024-05-24 DIAGNOSIS — C770 Secondary and unspecified malignant neoplasm of lymph nodes of head, face and neck: Secondary | ICD-10-CM | POA: Diagnosis not present

## 2024-05-24 DIAGNOSIS — Z8546 Personal history of malignant neoplasm of prostate: Secondary | ICD-10-CM | POA: Diagnosis not present

## 2024-05-24 DIAGNOSIS — C434 Malignant melanoma of scalp and neck: Secondary | ICD-10-CM | POA: Diagnosis not present

## 2024-05-24 DIAGNOSIS — R808 Other proteinuria: Secondary | ICD-10-CM | POA: Diagnosis not present

## 2024-05-24 DIAGNOSIS — I491 Atrial premature depolarization: Secondary | ICD-10-CM | POA: Diagnosis not present

## 2024-05-24 DIAGNOSIS — E785 Hyperlipidemia, unspecified: Secondary | ICD-10-CM | POA: Diagnosis not present

## 2024-05-24 DIAGNOSIS — E1122 Type 2 diabetes mellitus with diabetic chronic kidney disease: Secondary | ICD-10-CM | POA: Diagnosis not present

## 2024-05-24 DIAGNOSIS — Z0001 Encounter for general adult medical examination with abnormal findings: Secondary | ICD-10-CM | POA: Diagnosis not present

## 2024-05-24 DIAGNOSIS — Z23 Encounter for immunization: Secondary | ICD-10-CM | POA: Diagnosis not present
